# Patient Record
Sex: Female | Born: 1975
Health system: Southern US, Community
[De-identification: ages and names within clinical notes are randomized; demographics above are authoritative.]

## PROBLEM LIST (undated history)

## (undated) DIAGNOSIS — K2950 Unspecified chronic gastritis without bleeding: Secondary | ICD-10-CM

## (undated) DIAGNOSIS — K644 Residual hemorrhoidal skin tags: Secondary | ICD-10-CM

## (undated) DIAGNOSIS — F419 Anxiety disorder, unspecified: Secondary | ICD-10-CM

## (undated) DIAGNOSIS — F329 Major depressive disorder, single episode, unspecified: Secondary | ICD-10-CM

## (undated) DIAGNOSIS — F1020 Alcohol dependence, uncomplicated: Secondary | ICD-10-CM

## (undated) DIAGNOSIS — J45909 Unspecified asthma, uncomplicated: Secondary | ICD-10-CM

## (undated) DIAGNOSIS — E785 Hyperlipidemia, unspecified: Secondary | ICD-10-CM

## (undated) DIAGNOSIS — F32A Depression, unspecified: Secondary | ICD-10-CM

## (undated) DIAGNOSIS — G43909 Migraine, unspecified, not intractable, without status migrainosus: Secondary | ICD-10-CM

## (undated) DIAGNOSIS — D649 Anemia, unspecified: Secondary | ICD-10-CM

## (undated) DIAGNOSIS — K589 Irritable bowel syndrome without diarrhea: Secondary | ICD-10-CM

## (undated) DIAGNOSIS — T7840XA Allergy, unspecified, initial encounter: Secondary | ICD-10-CM

## (undated) DIAGNOSIS — K219 Gastro-esophageal reflux disease without esophagitis: Secondary | ICD-10-CM

## (undated) DIAGNOSIS — M1990 Unspecified osteoarthritis, unspecified site: Secondary | ICD-10-CM

## (undated) HISTORY — DX: Anemia, unspecified: D64.9

## (undated) HISTORY — DX: Anxiety disorder, unspecified: F41.9

## (undated) HISTORY — DX: Unspecified osteoarthritis, unspecified site: M19.90

## (undated) HISTORY — PX: OTHER SURGICAL HISTORY: SHX169

## (undated) HISTORY — DX: Allergy, unspecified, initial encounter: T78.40XA

## (undated) HISTORY — DX: Gastro-esophageal reflux disease without esophagitis: K21.9

## (undated) HISTORY — PX: KNEE SURGERY: SHX244

## (undated) HISTORY — PX: CERVICAL FUSION: SHX112

## (undated) HISTORY — DX: Major depressive disorder, single episode, unspecified: F32.9

## (undated) HISTORY — DX: Hyperlipidemia, unspecified: E78.5

## (undated) HISTORY — DX: Alcohol dependence, uncomplicated: F10.20

## (undated) HISTORY — PX: TONSILLECTOMY: SUR1361

## (undated) HISTORY — DX: Depression, unspecified: F32.A

## (undated) HISTORY — DX: Irritable bowel syndrome, unspecified: K58.9

## (undated) HISTORY — DX: Unspecified asthma, uncomplicated: J45.909

## (undated) HISTORY — PX: LAPAROSCOPIC HYSTERECTOMY: SHX1926

## (undated) HISTORY — DX: Unspecified chronic gastritis without bleeding: K29.50

## (undated) HISTORY — DX: Migraine, unspecified, not intractable, without status migrainosus: G43.909

## (undated) HISTORY — DX: Residual hemorrhoidal skin tags: K64.4

## (undated) HISTORY — PX: CHOLECYSTECTOMY: SHX55

---

## 2004-02-15 ENCOUNTER — Ambulatory Visit (HOSPITAL_COMMUNITY): Admission: RE | Admit: 2004-02-15 | Discharge: 2004-02-15 | Payer: Self-pay | Admitting: *Deleted

## 2004-02-23 ENCOUNTER — Other Ambulatory Visit: Admission: RE | Admit: 2004-02-23 | Discharge: 2004-02-23 | Payer: Self-pay | Admitting: Obstetrics and Gynecology

## 2004-02-26 ENCOUNTER — Other Ambulatory Visit: Admission: RE | Admit: 2004-02-26 | Discharge: 2004-02-26 | Payer: Self-pay | Admitting: Obstetrics and Gynecology

## 2004-05-22 ENCOUNTER — Inpatient Hospital Stay (HOSPITAL_COMMUNITY): Admission: EM | Admit: 2004-05-22 | Discharge: 2004-05-24 | Payer: Self-pay | Admitting: Emergency Medicine

## 2004-05-24 ENCOUNTER — Inpatient Hospital Stay (HOSPITAL_COMMUNITY): Admission: RE | Admit: 2004-05-24 | Discharge: 2004-05-27 | Payer: Self-pay | Admitting: Psychiatry

## 2005-06-14 ENCOUNTER — Inpatient Hospital Stay (HOSPITAL_COMMUNITY): Admission: EM | Admit: 2005-06-14 | Discharge: 2005-06-16 | Payer: Self-pay | Admitting: Emergency Medicine

## 2005-06-16 ENCOUNTER — Inpatient Hospital Stay (HOSPITAL_COMMUNITY): Admission: RE | Admit: 2005-06-16 | Discharge: 2005-06-21 | Payer: Self-pay | Admitting: Psychiatry

## 2005-06-16 ENCOUNTER — Ambulatory Visit: Payer: Self-pay | Admitting: Psychiatry

## 2010-09-29 ENCOUNTER — Emergency Department (HOSPITAL_COMMUNITY)
Admission: EM | Admit: 2010-09-29 | Discharge: 2010-09-29 | Payer: Self-pay | Source: Home / Self Care | Admitting: Emergency Medicine

## 2011-02-25 NOTE — H&P (Signed)
[name], [name]            ACCOUNT NO.:  [id number]   MEDICAL RECORD NO.:  [id number]          PATIENT TYPE:  EMS   LOCATION:  ED                           FACILITY:  [facility]   PHYSICIAN:  Hollice Espy, M.D.DATE OF BIRTH:  10-03-1976   DATE OF ADMISSION:  06/13/2005  DATE OF DISCHARGE:                                HISTORY & PHYSICAL   CONSULTANTS:  Antonietta Breach, M.D., psychiatry.   PRIMARY CARE PHYSICIAN:  None.   CHIEF COMPLAINT:  Overdose.   HISTORY OF PRESENT ILLNESS:  The patient is a 35 year old white female with  a past medical history of drug abuse, suicide attempts, and depression, who  tells me that she is unaware of the time frame etiology, but apparently she  was living in an apartment when she was kicked out.  She did not have  anywhere to go, and she took a mouthful of Risperdal.  She is not sure  exactly what time, but she feels like it was during the day.  She says the  last few days have been blurred together.  In addition, she tells me that a  few hours before that she smoked from crack cocaine.  She came into the  emergency room feeling quite agitated and jittery, and came in for further  evaluation and admission.  She says that she was, indeed, trying to kill  herself.  The patient, in the emergency room, had laboratories checked,  including a Tylenol level, which was normal, a blood sugar of 162, a  potassium level of 2.8.  Her alcohol and salicylate levels were normal.  A  Depakote level was found to be subtherapeutic, and LFT's were unremarkable.  Urine drug screen was drawn and is currently pending. The patient received  some supplemental potassium, IV fluids, and was monitored.  The ER attending  contacted poison control, and poison control recommended cardiac monitoring,  EKG, urinalysis, drug screen, and activated charcoal.  The ER attending was  made aware, but then after there were conflicting reports of the time of the  injection of  the Depakote, it was felt the ingestion was greater than 3  hours prior, and charcoal administration was not performed.  Currently, the  patient is awake and alert.  She says her jitteriness feels better.  She  denies any pain or nausea, and she is hungry.  She denies any headaches,  vision changes, dysphagia, chest pain, palpitations, shortness of breath,  wheeze, cough, abdominal pain, hematuria, dysuria, constipation, diarrhea,  focal extremity numbness, weakness, or pain.  She does confirm that she was,  indeed, trying to kill herself, and she has tried to before in the past.   PAST MEDICAL HISTORY:  1.  Multiple suicide attempts.  2.  Alcohol abuse.  3.  Depression.   MEDICATIONS:  1.  Zoloft.  2.  Risperdal.  3.  Seroquel.  4.  Depakote.  (Although she says her other medication she has not taken for several days,  and again the Depakote she took a mouthful of, which she does not know how  much.)   SOCIAL HISTORY:  She  admits to polysubstance abuse including crack cocaine.  She admits to drinking a few drinks, but nothing in the LAD day of so, and  positive for tobacco abuse.   FAMILY HISTORY:  Noncontributory.   ALLERGIES:  She has reported allergies to PENICILLIN.   PHYSICAL EXAMINATION:  VITAL SIGNS:  The patient's vitals on admission  revealed temperature of 96.8, heart rate initially 129, now down to 93,  blood pressure is 111/63, respirations 26, O2 saturation 97% on room air.  GENERAL:  The patient is alert and oriented x3 in no apparent distress.  HEENT:  Normocephalic and atraumatic.  Mucous membranes are slightly dry.  She has no carotid bruits.  HEART:  Regular rate and rhythm.  S1 and S2.  LUNGS:  Clear to auscultation bilaterally.  ABDOMEN:  Soft, nontender, nondistended.  Positive bowel sounds.  EXTREMITIES:  No clubbing, cyanosis, or edema.   LABORATORY WORK:  Tylenol level less than 10.  Sodium 139, potassium 2.8,  chloride 106, bicarbonate 22, BUN 6,  creatinine 1, glucose 162.  Urine drug  screen pending.  Alcohol level less than 5.  Salicylate level less than 4.  Depakote level less than 10, which is subtherapeutic for her.  Urinalysis  pending.  Urine pregnancy pending.  Liver function tests are negative.   ASSESSMENT AND PLAN:  1.  Drug overdose with Depakote, as well as crack cocaine abuse.  The      patient's tachycardia is likely from the crack cocaine, which is an      unknown period of ingestion.  I do not believe that this was earlier      today, and may have been approximately a day and a half ago, as the      patient's last few days, she says, are blurred together.  This is a      clear suicide attempt, or at least a cry for help, and I will admit the      patient for 24-hour observation in the step-down unit and treat      symptomatically.  According to poison control, Depakote's main toxicity      can lead to QT prolongation and respiratory depression, so we will      follow her, including cardiac enzymes, EKG, and follow up labs in the      morning.  2.  In regards to her hyperglycemia, she has no previous history of      diabetes.  Will check a hemoglobin A1C and CBGs b.i.d.  3.  In regards to her hypokalemia, she has already received supplemental      potassium, and will recheck in the morning and replace as needed.  4.  I have already put a consult in to Dr. Jeanie Sewer from psychiatry, who      will see the patient, and when she is medically stable will work on      transferring her to a mental health facility.      Hollice Espy, M.D.  Electronically Signed     SKK/MEDQ  D:  06/13/2005  T:  06/13/2005  Job:  [id number]   cc:   Antonietta Breach, M.D.

## 2011-02-25 NOTE — Discharge Summary (Signed)
[name], [name]            ACCOUNT NO.:  [id number]   MEDICAL RECORD NO.:  [id number]          PATIENT TYPE:  IPS   LOCATION:  0304                          [facility]ILITY[facility]  [facility]   PHYSICIAN:  Jeanice Lim, M.D. DATE OF BIRTH:  1976-01-09   DATE OF ADMISSION:  06/16/2005  DATE OF DISCHARGE:  06/21/2005                                 DISCHARGE SUMMARY   IDENTIFYING DATA:  This is a 35 year old single Caucasian female, single,  voluntarily admitted with a history of drug abuse prior to overdose.  Began  drinking a month ago, drinking out of control for the past two weeks, began  using cocaine after being intoxicated, became agitated, took a handful of  Risperdal, not taking medications consistently for the last two weeks.  Feels like I'm a screw up.  Shame, guilt, worthless.  Second [facility] admission.  Previous diagnosis of bipolar disorder.  Again, a  prior overdose.  History of cocaine and alcohol dependency and abuse.  History of multiple overdoses and polysubstance abuse.   MEDICATIONS:  Depakote, Zoloft and Risperdal.   ALLERGIES:  PENICILLIN and CODEINE.   PHYSICAL EXAMINATION:  Physical and neurologic exam within normal limits.   LABORATORY DATA:  Routine admission labs within normal limits.   MENTAL STATUS EXAM:  Fully alert, anxious.  Affect blunted.  Cooperative.  Speech normal.  Mood depressed and anxious.  Thought processes positive for  suicidal ideation.  No homicidal ideation.  No psychotic symptoms.  Impulse  control questionable.  Judgment and insight were fair to impaired.  Cognitively intact.   ADMISSION DIAGNOSES:  AXIS I:  Bipolar disorder, type 2.  Alcohol  dependence. Cocaine, likely dependence.  Polysubstance abuse.  Rule out  substance-induced mood disorder superimposed on bipolar disorder, type 2.  AXIS II:  Deferred.  AXIS III:  Status post Risperdal overdose and normocytic anemia.  AXIS IV:  Severe (problems with frequent  relapses and sequela of substance  abuse, also reports having some financial issues).  AXIS V:  25/65.   HOSPITAL COURSE:  The patient was admitted and ordered routine p.r.n.  medications and underwent further monitoring.  Was encouraged to participate  in individual, group and milieu therapy.  The patient was optimized on  medications and monitored for safety.  Monitored for withdrawal symptoms and  detox.  Emergency room cleared patient medically prior to transfer and  patient was stabilized.   CONDITION ON DISCHARGE:  Discharged in improved condition, reporting  motivation to be compliant with the aftercare plan.  Willing to remain  abstinent.  Had a support system.  Would be discharged in the care of the  mother and relapse prevention plan was also in place.  The patient was again  given medication education.   DISCHARGE MEDICATIONS:  1.  Seroquel 25 mg, 1/2 q.8h. p.r.n.  2.  Vistaril 50 mg q.8h. p.r.n. anxiety.  3.  Trazodone 50 mg at 8 p.m.  4.  Depakote 500 mg, 3 at 8 p.m.  5.  Risperdal 2 mg at 8 p.m.  6.  Zoloft 50 mg q.a.m.  7.  Ambien 10  mg q.h.s. p.r.n. insomnia.  8.  Topamax 25 mg q.a.m. and 50 mg at 6 p.m.   FOLLOW UP:  The patient was discharged to follow up with outpatient  medication management and therapy.   DISCHARGE DIAGNOSES:  AXIS I:  Bipolar disorder, type 2.  Alcohol  dependence. Cocaine, likely dependence.  Polysubstance abuse.  Rule out  substance-induced mood disorder superimposed on bipolar disorder, type 2.  AXIS II:  Deferred.  AXIS III:  Status post Risperdal overdose and normocytic anemia.  AXIS IV:  Severe (problems with frequent relapses and sequela of substance  abuse, also reports having some financial issues).  AXIS V:  GAF on discharge 55.      Jeanice Lim, M.D.  Electronically Signed     JEM/MEDQ  D:  08/03/2005  T:  08/04/2005  Job:  [id number]

## 2011-02-25 NOTE — Discharge Summary (Signed)
[name], [name]            ACCOUNT NO.:  [id number]   MEDICAL RECORD NO.:  [id number]          PATIENT TYPE:  IPS   LOCATION:  0304                          [facility]ACILITY:  [facility]   PHYSICIAN:  Deirdre Peer. Polite, M.D. DATE OF BIRTH:  09-23-1976   DATE OF ADMISSION:  06/16/2005  DATE OF DISCHARGE:  06/21/2005                                 DISCHARGE SUMMARY   DISCHARGE DIAGNOSES:  1.  Suicide attempt by ingestion of Risperdal.  2.  History of multiple suicide attempts.  3.  History of alcohol abuse.  4.  Depression.   DISPOSITION:  The patient will be discharged to [facility] for further treatment for suicide attempt.   CONSULTATIONS:  Dr. Jeanie Sewer.   DISCHARGE MEDICATIONS:  None.   HISTORY OF PRESENT ILLNESS:  A 35 year old female with multiple medical  problems as stated above who presented to the ED for evaluation after  suicide attempt.  Admission was deemed necessary for further evaluation and  treatment.  Please see dictated H&P for further details.   PAST MEDICAL HISTORY:  Significant for multiple suicide attempts, alcohol  abuse and depression.   ADMISSION MEDICATIONS:  1.  Zoloft.  2.  Risperdal.  3.  Seroquel.  4.  Depakote.   SOCIAL HISTORY:  Per admission H&P.   FAMILY HISTORY:  Noncontributory.   ALLERGIES:  Reported allergy to PENICILLIN.   HOSPITAL COURSE:  The patient was admitted to a telemetry bed for evaluation  and treatment of suicide attempt.  The patient had labs.  CBC essentially  within normal limits.  BMET within normal limits.  Urine drug screen  positive for benzodiazepines.  The patient was monitored in the ICU, and  provided with symptomatic care.  Psychiatric consultation was obtained.  The patient was ultimately seen by  psychiatry, and the patient volunteered to be admitted to the [facility] for further treatment of her suicidal ideation.  The patient  was discharged in stable  condition.      Deirdre Peer. Polite, M.D.  Electronically Signed     RDP/MEDQ  D:  06/29/2005  T:  06/30/2005  Job:  [id number]
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
## 2012-01-28 ENCOUNTER — Emergency Department (HOSPITAL_COMMUNITY): Payer: Commercial Indemnity

## 2012-01-28 ENCOUNTER — Encounter (HOSPITAL_COMMUNITY): Payer: Self-pay | Admitting: *Deleted

## 2012-01-28 ENCOUNTER — Emergency Department (HOSPITAL_COMMUNITY)
Admission: EM | Admit: 2012-01-28 | Discharge: 2012-01-28 | Disposition: A | Discharge status: Home / Self Care | Payer: Commercial Indemnity | Attending: Emergency Medicine | Admitting: Emergency Medicine

## 2012-01-28 DIAGNOSIS — R296 Repeated falls: Secondary | ICD-10-CM | POA: Insufficient documentation

## 2012-01-28 DIAGNOSIS — M79609 Pain in unspecified limb: Secondary | ICD-10-CM | POA: Insufficient documentation

## 2012-01-28 DIAGNOSIS — F17200 Nicotine dependence, unspecified, uncomplicated: Secondary | ICD-10-CM | POA: Insufficient documentation

## 2012-01-28 DIAGNOSIS — M25569 Pain in unspecified knee: Secondary | ICD-10-CM | POA: Insufficient documentation

## 2012-01-28 DIAGNOSIS — S93409A Sprain of unspecified ligament of unspecified ankle, initial encounter: Secondary | ICD-10-CM | POA: Insufficient documentation

## 2012-01-28 MED ORDER — KETOROLAC TROMETHAMINE 60 MG/2ML IM SOLN
60.0000 mg | Freq: Once | INTRAMUSCULAR | Status: AC
  Administered 2012-01-28: 60 mg via INTRAMUSCULAR
  Filled 2012-01-28: qty 2

## 2012-01-28 MED ORDER — HYDROMORPHONE HCL PF 2 MG/ML IJ SOLN
2.0000 mg | Freq: Once | INTRAMUSCULAR | Status: AC
  Administered 2012-01-28: 2 mg via INTRAMUSCULAR
  Filled 2012-01-28: qty 1

## 2012-01-28 MED ORDER — ONDANSETRON 4 MG PO TBDP
4.0000 mg | ORAL_TABLET | Freq: Once | ORAL | Status: AC
  Administered 2012-01-28: 4 mg via ORAL
  Filled 2012-01-28: qty 1

## 2012-01-28 MED ORDER — OXYCODONE-ACETAMINOPHEN 5-325 MG PO TABS: 1.0000 | ORAL_TABLET | Freq: Four times a day (QID) | ORAL | Status: AC | PRN

## 2012-01-28 NOTE — Progress Notes (Signed)
Orthopedic Tech Progress Note Patient Details:  [name] October 08, 1976 [id number]  Other Ortho Devices Type of Ortho Device: Crutches Ortho Device Interventions: Ordered  Type of Splint: Ankle Air Splint Location: (L) LE Splint Interventions: Application    [name] 01/28/2012, 6:32 PM

## 2012-01-28 NOTE — ED Notes (Addendum)
To ED for eval aft twisting left ankle, fell, and injured left knee PTA. Swelling noted to ankle.

## 2012-01-28 NOTE — Discharge Instructions (Signed)
Ankle Sprain An ankle sprain is an injury to the strong, fibrous tissues (ligaments) that hold the bones of your ankle joint together.  CAUSES Ankle sprain usually is caused by a fall or by twisting your ankle. People who participate in sports are more prone to these types of injuries.  SYMPTOMS  Symptoms of ankle sprain include:  Pain in your ankle. The pain may be present at rest or only when you are trying to stand or walk.   Swelling.   Bruising. Bruising may develop immediately or within 1 to 2 days after your injury.   Difficulty standing or walking.  DIAGNOSIS  Your caregiver will ask you details about your injury and perform a physical exam of your ankle to determine if you have an ankle sprain. During the physical exam, your caregiver will press and squeeze specific areas of your foot and ankle. Your caregiver will try to move your ankle in certain ways. An X-ray exam may be done to be sure a bone was not broken or a ligament did not separate from one of the bones in your ankle (avulsion).  TREATMENT  Certain types of braces can help stabilize your ankle. Your caregiver can make a recommendation for this. Your caregiver may recommend the use of medication for pain. If your sprain is severe, your caregiver may refer you to a surgeon who helps to restore function to parts of your skeletal system (orthopedist) or a physical therapist. HOME CARE INSTRUCTIONS  Apply ice to your injury for 1 to 2 days or as directed by your caregiver. Applying ice helps to reduce inflammation and pain.  Put ice in a plastic bag.   Place a towel between your skin and the bag.   Leave the ice on for 15 to 20 minutes at a time, every 2 hours while you are awake.   Take over-the-counter or prescription medicines for pain, discomfort, or fever only as directed by your caregiver.   Keep your injured leg elevated, when possible, to lessen swelling.   If your caregiver recommends crutches, use them as  instructed. Gradually, put weight on the affected ankle. Continue to use crutches or a cane until you can walk without feeling pain in your ankle.   If you have a plaster splint, wear the splint as directed by your caregiver. Do not rest it on anything harder than a pillow the first 24 hours. Do not put weight on it. Do not get it wet. You may take it off to take a shower or bath.   You may have been given an elastic bandage to wear around your ankle to provide support. If the elastic bandage is too tight (you have numbness or tingling in your foot or your foot becomes cold and blue), adjust the bandage to make it comfortable.   If you have an air splint, you may blow more air into it or let air out to make it more comfortable. You may take your splint off at night and before taking a shower or bath.   Wiggle your toes in the splint several times per day if you are able.  SEEK MEDICAL CARE IF:   You have an increase in bruising, swelling, or pain.   Your toes feel cold.   Pain relief is not achieved with medication.  SEEK IMMEDIATE MEDICAL CARE IF: Your toes are numb or blue or you have severe pain. MAKE SURE YOU:   Understand these instructions.   Will watch your condition.     Will get help right away if you are not doing well or get worse.  Document Released: 09/26/2005 Document Revised: 09/15/2011 Document Reviewed: 04/30/2008 [employer] Patient Information 2012 [employer]. 

## 2012-01-28 NOTE — ED Provider Notes (Addendum)
History     CSN: [id number]  Arrival date & time 01/28/12  1638   First MD Initiated Contact with Patient 01/28/12 1716      Chief Complaint  Patient presents with  . Leg Injury  . Ankle Injury    (Consider location/radiation/quality/duration/timing/severity/associated sxs/prior treatment) Patient is a 36 y.o. female presenting with lower extremity injury. The history is provided by the patient.  Ankle Injury This is a new (She was running to Catch a football when she jumped she landed on her foot wrong) problem. The current episode started 1 to 2 hours ago. The problem occurs constantly. The problem has not changed since onset.Associated symptoms comments: None except for swelling of the left ankle. The symptoms are aggravated by walking, standing and twisting. The symptoms are relieved by ice. She has tried a cold compress for the symptoms. The treatment provided no relief.    History reviewed. No pertinent past medical history.  Past Surgical History  Procedure Date  . Cervical fusion     History reviewed. No pertinent family history.  History  Substance Use Topics  . Smoking status: Current Some Day Smoker -- 0.5 packs/day    Types: Cigarettes  . Smokeless tobacco: Not on file  . Alcohol Use: No    OB History    Grav Para Term Preterm Abortions TAB SAB Ect Mult Living                  Review of Systems  All other systems reviewed and are negative.    Allergies  Penicillins  Home Medications   Current Outpatient Rx  Name Route Sig Dispense Refill  . ALPRAZOLAM 1 MG PO TABS Oral Take 1 mg by mouth at bedtime.    Marland Kitchen CIMZIA Alicia Subcutaneous Inject into the skin every 30 (thirty) days.    Marland Kitchen DIVALPROEX SODIUM ER 500 MG PO TB24 Oral Take 1,500 mg by mouth at bedtime.    Marland Kitchen ESTRADIOL 1 MG PO TABS Oral Take 1 mg by mouth at bedtime.    Marland Kitchen FOLIC ACID 1 MG PO TABS Oral Take 1 mg by mouth at bedtime.    Marland Kitchen LITHIUM CARBONATE 300 MG PO CAPS Oral Take 300 mg by mouth at  bedtime.    Marland Kitchen LORATADINE 10 MG PO TABS Oral Take 10 mg by mouth every morning.    Marland Kitchen OXCARBAZEPINE ER 600 MG PO TB24 Oral Take 1,800 mg by mouth at bedtime.    Marland Kitchen PHENTERMINE HCL 37.5 MG PO CAPS Oral Take 37.5 mg by mouth every morning.      BP 133/81  Pulse 91  Temp(Src) 98.6 F (37 C) (Oral)  Resp 16  SpO2 96%  Physical Exam  Nursing note and vitals reviewed. Constitutional: She is oriented to person, place, and time. She appears well-developed and well-nourished. She appears distressed.  HENT:  Head: Normocephalic and atraumatic.  Musculoskeletal: She exhibits tenderness.       Left ankle: She exhibits decreased range of motion and swelling. tenderness. Lateral malleolus and proximal fibula tenderness found. No posterior TFL and no head of 5th metatarsal tenderness found.  Neurological: She is alert and oriented to person, place, and time.  Skin: Skin is warm and dry. No rash noted. No erythema.    ED Course  Procedures (including critical care time)  Labs Reviewed - No data to display Dg Ankle Complete Left  01/28/2012  *RADIOLOGY REPORT*  Clinical Data: Left leg pain/swelling, ankle injury  LEFT ANKLE COMPLETE -  3+ VIEW  Comparison: None.  Findings: No fracture or dislocation is seen.  The ankle mortise is intact.  The base of the fifth metatarsal is unremarkable.  Small plantar calcaneal enthesopathy.  Moderate soft tissue swelling, most prominent laterally.  IMPRESSION: No fracture or dislocation is seen.  Moderate lateral soft tissue swelling.  Original Report Authenticated By: Charline Bills, M.D.   Dg Knee Complete 4 Views Left  01/28/2012  *RADIOLOGY REPORT*  Clinical Data: Trauma and pain.  Patient unable to remove clothing.  LEFT KNEE - COMPLETE 4+ VIEW  Comparison: None.  Findings: No acute fracture or dislocation.  Overlying clothing artifact. No joint effusion.  IMPRESSION: No acute osseous abnormality.  Original Report Authenticated By: Consuello Bossier, M.D.     1.  Ankle sprain       MDM   Patient with a mechanical fall and he diverted her left foot with ankle pain and swelling. Mild fibular head tenderness as well. Plain films of the ankle and knee including the proximal fibular head probably within normal limits. Severe ankle sprain. Patient placed in splint and given crutches with followup.        Gwyneth Sprout, MD 01/28/12 1816  Gwyneth Sprout, MD 01/28/12 1610

## 2012-09-08 LAB — HM PAP SMEAR: HM Pap smear: NORMAL

## 2013-02-05 ENCOUNTER — Encounter: Payer: Self-pay | Admitting: Internal Medicine

## 2013-02-22 ENCOUNTER — Encounter: Payer: Self-pay | Admitting: Internal Medicine

## 2013-02-26 ENCOUNTER — Encounter: Payer: Self-pay | Admitting: Internal Medicine

## 2013-02-26 ENCOUNTER — Other Ambulatory Visit (INDEPENDENT_AMBULATORY_CARE_PROVIDER_SITE_OTHER): Payer: 59

## 2013-02-26 ENCOUNTER — Ambulatory Visit (INDEPENDENT_AMBULATORY_CARE_PROVIDER_SITE_OTHER): Payer: 59 | Admitting: Internal Medicine

## 2013-02-26 ENCOUNTER — Other Ambulatory Visit: Payer: Self-pay | Admitting: Gastroenterology

## 2013-02-26 VITALS — BP 126/72 | HR 93 | Ht 65.0 in | Wt 242.0 lb

## 2013-02-26 DIAGNOSIS — K529 Noninfective gastroenteritis and colitis, unspecified: Secondary | ICD-10-CM

## 2013-02-26 DIAGNOSIS — K625 Hemorrhage of anus and rectum: Secondary | ICD-10-CM

## 2013-02-26 DIAGNOSIS — G43709 Chronic migraine without aura, not intractable, without status migrainosus: Secondary | ICD-10-CM | POA: Insufficient documentation

## 2013-02-26 DIAGNOSIS — K219 Gastro-esophageal reflux disease without esophagitis: Secondary | ICD-10-CM

## 2013-02-26 DIAGNOSIS — F319 Bipolar disorder, unspecified: Secondary | ICD-10-CM | POA: Insufficient documentation

## 2013-02-26 DIAGNOSIS — R197 Diarrhea, unspecified: Secondary | ICD-10-CM

## 2013-02-26 DIAGNOSIS — IMO0002 Reserved for concepts with insufficient information to code with codable children: Secondary | ICD-10-CM | POA: Insufficient documentation

## 2013-02-26 DIAGNOSIS — M069 Rheumatoid arthritis, unspecified: Secondary | ICD-10-CM | POA: Insufficient documentation

## 2013-02-26 LAB — COMPREHENSIVE METABOLIC PANEL
Albumin: 4 g/dL (ref 3.5–5.2)
Alkaline Phosphatase: 73 U/L (ref 39–117)
BUN: 10 mg/dL (ref 6–23)
CO2: 29 mEq/L (ref 19–32)
Calcium: 9.5 mg/dL (ref 8.4–10.5)
Chloride: 103 mEq/L (ref 96–112)
GFR: 97.92 mL/min (ref >60.00)
Glucose, Bld: 86 mg/dL (ref 70–99)
Potassium: 4.4 mEq/L (ref 3.5–5.1)
Total Protein: 7.5 g/dL (ref 6.0–8.3)

## 2013-02-26 LAB — CBC
MCV: 90 fl (ref 78.0–100.0)
Platelets: 290 10*3/uL (ref 150.0–400.0)
RBC: 4.49 Mil/uL (ref 3.87–5.11)
WBC: 8.5 10*3/uL (ref 4.5–10.5)

## 2013-02-26 MED ORDER — OMEPRAZOLE 40 MG PO CPDR: 40.0000 mg | DELAYED_RELEASE_CAPSULE | Freq: Every day | ORAL | Status: DC

## 2013-02-26 MED ORDER — PEG-KCL-NACL-NASULF-NA ASC-C 100 G PO SOLR: 1.0000 | Freq: Once | ORAL | Status: DC

## 2013-02-26 MED ORDER — DIPHENOXYLATE-ATROPINE 2.5-0.025 MG PO TABS: 1.0000 | ORAL_TABLET | Freq: Four times a day (QID) | ORAL | Status: DC | PRN

## 2013-02-26 NOTE — Patient Instructions (Addendum)
You have been scheduled for a colonoscopy with propofol. Please follow written instructions given to you at your visit today.  Please pick up your prep kit at the pharmacy within the next 1-3 days. If you use inhalers (even only as needed), please bring them with you on the day of your procedure. Your physician has requested that you go to www.startemmi.com and enter the access code given to you at your visit today. This web site gives a general overview about your procedure. However, you should still follow specific instructions given to you by our office regarding your preparation for the procedure.  Your physician has requested that you go to the basement for the following lab work before leaving today: Celiac panel, TSH, CBC, CMP, Fecal elastace, GI Pathogen panel  We have sent the following medications to your pharmacy for you to pick up at your convenience: moviprep, Lomotil  You have been scheduled to see   Dr. Beverely Low  On July 04/11/2013 @ 3:00pm please arrive at [facility] is located: [address] [place], [place] [phone]                                               We are excited to introduce MyChart, a new best-in-class service that provides you online access to important information in your electronic medical record. We want to make it easier for you to view your health information - all in one secure location - when and where you need it. We expect MyChart will enhance the quality of care and service we provide.  When you register for MyChart, you can:    View your test results.    Request appointments and receive appointment reminders via email.    Request medication renewals.    View your medical history, allergies, medications and immunizations.    Communicate with your physician's office through a password-protected site.    Conveniently print information such as your medication lists.  To find out if MyChart is right for you, please talk to a  member of our clinical staff today. We will gladly answer your questions about this free health and wellness tool.  If you are age 38 or older and want a member of your family to have access to your record, you must provide written consent by completing a proxy form available at our office. Please speak to our clinical staff about guidelines regarding accounts for patients younger than age 76.  As you activate your MyChart account and need any technical assistance, please call the MyChart technical support line at [phone]) or email your question to [email or web address]. If you email your question(s), please include your name, a return phone number and the best time to reach you.  If you have non-urgent health-related questions, you can send a message to our office through MyChart at [email or web address]. If you have a medical emergency, call 911.  Thank you for using MyChart as your new health and wellness resource!   MyChart licensed from [employer],  [id number]. Patents Pending.

## 2013-02-26 NOTE — Progress Notes (Signed)
Patient ID: [name], female   DOB: 09-25-1976, 37 y.o.   MRN: [id number] HPI: [name] is a 37 year old female with a past medical history significant for bipolar depression with rapid cycling, migraine, rheumatoid arthritis on methotrexate and intermittently adalimumab, and irritable bowel syndrome who is seen to evaluate worsening chronic diarrhea with rectal bleeding. She is alone today. She reports she is "almost always had trouble with her stomach" and has had issues with diarrhea and loose stools dating back over 10 years. She notes however, over the last 6 months she's had significant worsening of her diarrhea. This is occurring on a daily basis and can happen 5-15 times a day. She reports it does wake her from sleep every now and then. She reports she gets an immediate lower abdominal cramping associated with bowel movement along with an urgency which is worse with eating. Occasionally she does see red blood mixed in with her diarrhea. She denies fevers or chills. She reports often feeling hot but denies flushing. No rashes. She's had some nausea and vomiting, and notes that this is gone on for years and years. Her vomiting occasionally history of regurgitation or reflux. She is currently taking omeprazole 40 mg twice daily for heartburn with good symptom control currently. She started Lamictal approximately a month ago for her bipolar disorder. She's been on carbamazepine for about a year for this as well.  She does note some abdominal bloating and increase in lower GI gas. She had her gallbladder out in 2000 and felt like this worsened her diarrhea for some time.  She reports she is taking methotrexate 12.5 mg per week for her rheumatoid arthritis and has been prescribed adalimumab. Due to insurance and financial reasons she estimates she's taken this approximately 3 times in the last 6 months. She denies significant weight loss. Appetite fluctuates.  Past Medical History  Diagnosis Date   . Migraine   . Alcoholism   . Arthritis   . Asthma   . Depression   . GERD (gastroesophageal reflux disease)   . IBS (irritable bowel syndrome)      Past Surgical History  Procedure Laterality Date  . Cervical fusion    . Cholecystectomy    . Laparoscopic hysterectomy      Current Outpatient Prescriptions  Medication Sig Dispense Refill  . adalimumab (HUMIRA) 40 MG/0.8ML injection Inject 40 mg into the skin once.      . ALPRAZolam (XANAX) 1 MG tablet Take 1 mg by mouth as needed.       . carbamazepine (EQUETRO) 200 MG CP12 Take 400 mg by mouth daily.      . Cyanocobalamin (VITAMIN B-12) 1000 MCG/15ML LIQD Take 15 mLs by mouth every 30 (thirty) days.      . Diclofenac Potassium (ZIPSOR) 25 MG CAPS Take 1 capsule by mouth as needed.      Marland Kitchen estradiol (ESTRACE) 1 MG tablet Take 1 mg by mouth at bedtime.      . fexofenadine (ALLEGRA) 180 MG tablet Take 180 mg by mouth daily.      . folic acid (FOLVITE) 1 MG tablet Take 1 mg by mouth at bedtime.      . methotrexate (RHEUMATREX) 2.5 MG tablet Take 5 mg by mouth once a week. Caution:Chemotherapy. Protect from light.      . phentermine 37.5 MG capsule Take 37.5 mg by mouth every morning.      . SUMAtriptan (IMITREX) 50 MG tablet Take 50 mg by mouth every 2 (two)  hours as needed for migraine.      . diphenoxylate-atropine (LOMOTIL) 2.5-0.025 MG per tablet Take 1 tablet by mouth 4 (four) times daily as needed for diarrhea or loose stools.  30 tablet  0  . omeprazole (PRILOSEC) 40 MG capsule Take 1 capsule (40 mg total) by mouth daily.  30 capsule  0  . peg 3350 powder (MOVIPREP) 100 G SOLR Take 1 kit (100 g total) by mouth once.  1 kit  0   No current facility-administered medications for this visit.    Allergies  Allergen Reactions  . Penicillins Other (See Comments)    "skin turns black"    Family History  Problem Relation Age of Onset  . Breast cancer Paternal Grandmother   . Heart disease Maternal Grandfather     History   Substance Use Topics  . Smoking status: Current Every Day Smoker -- 0.50 packs/day    Types: Cigarettes  . Smokeless tobacco: Never Used  . Alcohol Use: No    ROS: As per history of present illness, otherwise negative  BP 126/72  Pulse 93  Ht 5\' 5"  (1.651 m)  Wt 242 lb (109.77 kg)  BMI 40.27 kg/m2 Constitutional: Well-developed and well-nourished. No distress. HEENT: Normocephalic and atraumatic. Oropharynx is clear and moist. No oropharyngeal exudate. Conjunctivae are normal.  No scleral icterus. Neck: Neck supple. Trachea midline. Cardiovascular: Normal rate, regular rhythm and intact distal pulses. No M/R/G Pulmonary/chest: Effort normal and breath sounds normal. No wheezing, rales or rhonchi. Abdominal: Soft, mild diffuse tenderness without rebound or guarding, nondistended. Bowel sounds active throughout.  Extremities: no clubbing, cyanosis, or edema Lymphadenopathy: No cervical adenopathy noted. Neurological: Alert and oriented to person place and time. Skin: Skin is warm and dry. No rashes noted. Psychiatric: Normal mood and affect. Behavior is normal.  ASSESSMENT/PLAN:  37 year old female with a past medical history significant for bipolar depression with rapid cycling, migraine, rheumatoid arthritis on methotrexate and intermittently adalimumab, and irritable bowel syndrome who is seen to evaluate worsening chronic diarrhea with rectal bleeding.   1.  Chronic diarrhea, intermittent rectal bleeding -- it sounds like certainly some of her GI symptoms are irritable, but the bleeding with her diarrhea is not consistent with irritable bowel syndrome. I recommended labs today to include celiac panel, TSH, CBC, CMP. I will also check a GI pathogen panel to ensure there is no ongoing infectious process. We will collect a fecal elastace. I recommended colonoscopy, and after discussion of the risks and benefits she is agreeable to proceed. This will be performed assuming her stool  studies are negative. I've given her a prescription for Lomotil to be used 4 times a day as needed for diarrhea while we continue to investigate the etiology.  2.  GERD -- she will continue on omeprazole 40 mg twice daily as written by her primary provider.  3.  PCP -- she requests a new primary care in our system, and she will be referred to the [facility] location at her request.

## 2013-02-27 LAB — GASTROINTESTINAL PATHOGEN PANEL PCR
Campylobacter, PCR: NEGATIVE
Cryptosporidium, PCR: NEGATIVE
Giardia lamblia, PCR: NEGATIVE
Norovirus, PCR: POSITIVE — CR
Rotavirus A, PCR: NEGATIVE
Shigella, PCR: NEGATIVE

## 2013-02-27 LAB — TISSUE TRANSGLUTAMINASE, IGA: Transglutaminase IgA: <2 U/mL (ref 0–3)

## 2013-03-05 ENCOUNTER — Encounter: Payer: Self-pay | Admitting: Family Medicine

## 2013-03-05 NOTE — Telephone Encounter (Signed)
Please advise.//AB/CMA 

## 2013-03-14 ENCOUNTER — Encounter: Payer: Self-pay | Admitting: Internal Medicine

## 2013-03-14 ENCOUNTER — Ambulatory Visit (AMBULATORY_SURGERY_CENTER): Payer: 59 | Admitting: Internal Medicine

## 2013-03-14 VITALS — BP 124/71 | HR 89 | Temp 97.9°F | Resp 19 | Ht 65.0 in | Wt 242.0 lb

## 2013-03-14 DIAGNOSIS — K529 Noninfective gastroenteritis and colitis, unspecified: Secondary | ICD-10-CM

## 2013-03-14 DIAGNOSIS — D126 Benign neoplasm of colon, unspecified: Secondary | ICD-10-CM

## 2013-03-14 DIAGNOSIS — K625 Hemorrhage of anus and rectum: Secondary | ICD-10-CM

## 2013-03-14 DIAGNOSIS — R197 Diarrhea, unspecified: Secondary | ICD-10-CM

## 2013-03-14 MED ORDER — SODIUM CHLORIDE 0.9 % IV SOLN: 500.0000 mL | INTRAVENOUS | Status: DC

## 2013-03-14 NOTE — Progress Notes (Signed)
Called to room to assist during endoscopic procedure.  Patient ID and intended procedure confirmed with present staff. Received instructions for my participation in the procedure from the performing physician.  

## 2013-03-14 NOTE — Progress Notes (Signed)
Patient did not experience any of the following events: a burn prior to discharge; a fall within the facility; wrong site/side/patient/procedure/implant event; or a hospital transfer or hospital admission upon discharge from the facility. (G8907) Patient did not have preoperative order for IV antibiotic SSI prophylaxis. (G8918)  

## 2013-03-14 NOTE — Patient Instructions (Addendum)

## 2013-03-14 NOTE — Op Note (Signed)
[facility] [address] [place], [address]   COLONOSCOPY PROCEDURE REPORT  PATIENT: Emory, Leaver  MR#: [id number] BIRTHDATE: 08-18-1976 , 37  yrs. old GENDER: Female ENDOSCOPIST: Beverley Fiedler, MD PROCEDURE DATE:  03/14/2013 PROCEDURE:   Colonoscopy with biopsy ASA CLASS:   Class II INDICATIONS:chronic diarrhea and Rectal Bleeding. MEDICATIONS: MAC sedation, administered by CRNA and propofol (Diprivan) 450mg  IV  DESCRIPTION OF PROCEDURE:   After the risks benefits and alternatives of the procedure were thoroughly explained, informed consent was obtained.  A digital rectal exam revealed no rectal mass.   The LB [id number]  endoscope was introduced through the anus and advanced to the terminal ileum which was intubated for a short distance. No adverse events experienced.   The quality of the prep was good, using MoviPrep  The instrument was then slowly withdrawn as the colon was fully examined.     COLON FINDINGS: The mucosa appeared normal in the terminal ileum. A normal appearing cecum, ileocecal valve, and appendiceal orifice were identified.  The ascending, hepatic flexure, transverse, splenic flexure, descending, sigmoid colon and rectum appeared unremarkable.  No polyps or cancers were seen.  Multiple random biopsies of the area were performed.  Retroflexed views revealed external hemorrhoids. The time to cecum=2 minutes 54 seconds. Withdrawal time=9 minutes 00 seconds.  The scope was withdrawn and the procedure completed.  COMPLICATIONS: There were no complications.  ENDOSCOPIC IMPRESSION: 1.   Normal mucosa in the terminal ileum 2.   Normal colon; multiple random biopsies of the area were performed 3.   Small external hemorrhoids  RECOMMENDATIONS: 1.  Await pathology results 2.  Continue Lomotil as directed for 3.  Office follow-up in about 3-4 weeks to discuss other therapy for chronic diarrhea   eSigned:  Beverley Fiedler, MD  03/14/2013 2:33 PM   cc: The Patient

## 2013-03-15 ENCOUNTER — Telehealth: Payer: Self-pay | Admitting: *Deleted

## 2013-03-15 NOTE — Telephone Encounter (Signed)
Left message that we called for f/u 

## 2013-03-18 ENCOUNTER — Telehealth: Payer: Self-pay | Admitting: Gastroenterology

## 2013-03-18 NOTE — Telephone Encounter (Signed)
Called [facility] lab to find out why we have not received a result on the fecal elastase lab that shows collected, and all other labs have come back. Spoke to [employer], she said they do not have any more sample left and cannot do the lab. Per Dr. Rhea Belton when pt comes in for follow up we will have to give another sample.

## 2013-03-19 ENCOUNTER — Encounter: Payer: Self-pay | Admitting: Internal Medicine

## 2013-03-26 ENCOUNTER — Encounter: Payer: Self-pay | Admitting: Internal Medicine

## 2013-03-29 ENCOUNTER — Ambulatory Visit (INDEPENDENT_AMBULATORY_CARE_PROVIDER_SITE_OTHER): Payer: 59 | Admitting: Internal Medicine

## 2013-03-29 ENCOUNTER — Encounter: Payer: Self-pay | Admitting: Internal Medicine

## 2013-03-29 ENCOUNTER — Telehealth: Payer: Self-pay | Admitting: Gastroenterology

## 2013-03-29 VITALS — BP 118/80 | HR 88 | Ht 65.5 in | Wt 251.5 lb

## 2013-03-29 DIAGNOSIS — K589 Irritable bowel syndrome without diarrhea: Secondary | ICD-10-CM

## 2013-03-29 DIAGNOSIS — R197 Diarrhea, unspecified: Secondary | ICD-10-CM

## 2013-03-29 DIAGNOSIS — K529 Noninfective gastroenteritis and colitis, unspecified: Secondary | ICD-10-CM

## 2013-03-29 MED ORDER — ALOSETRON HCL 0.5 MG PO TABS: 0.5000 mg | ORAL_TABLET | Freq: Two times a day (BID) | ORAL | Status: DC

## 2013-03-29 NOTE — Patient Instructions (Addendum)
We have sent the following medications to your pharmacy for you to pick up at your convenience: Lotronex  Please call us with any constipation or abdominal pain  Follow up in 6-8 weeks                                               We are excited to introduce MyChart, a new best-in-class service that provides you online access to important information in your electronic medical record. We want to make it easier for you to view your health information - all in one secure location - when and where you need it. We expect MyChart will enhance the quality of care and service we provide.  When you register for MyChart, you can:    View your test results.    Request appointments and receive appointment reminders via email.    Request medication renewals.    View your medical history, allergies, medications and immunizations.    Communicate with your physician's office through a password-protected site.    Conveniently print information such as your medication lists.  To find out if MyChart is right for you, please talk to a member of our clinical staff today. We will gladly answer your questions about this free health and wellness tool.  If you are age 38 or older and want a member of your family to have access to your record, you must provide written consent by completing a proxy form available at our office. Please speak to our clinical staff about guidelines regarding accounts for patients younger than age 103.  As you activate your MyChart account and need any technical assistance, please call the MyChart technical support line at ([phone]-CHART [phone]) or email your question to [email or web address]. If you email your question(s), please include your name, a return phone number and the best time to reach you.  If you have non-urgent health-related questions, you can send a message to our office through MyChart at [email or web address]. If you have a medical emergency, call  911.  Thank you for using MyChart as your new health and wellness resource!   MyChart licensed from [employer],  [id number]. Patents Pending.

## 2013-03-29 NOTE — Progress Notes (Signed)
Subjective:    Patient ID: [name], female    DOB: 07-27-1976, 37 y.o.   MRN: [id number]  HPI Ms. Dykstra is a 37 year old female with a past medical history significant for bipolar depression with rapid cycling, migraine, rheumatoid arthritis on methotrexate and intermittently adalimumab, and irritable bowel syndrome who is seen in followup. She came for colonoscopy to evaluate chronic diarrhea on 03/14/2013. This revealed normal glucose and the terminal ileum, normal colon, and external hemorrhoids. Random biopsies performed throughout the colon were unremarkable. This showed benign colonic mucosa with no significant inflammation identified. She continues to have chronic daily diarrhea with bowel movements occurring 5-15 times per day. This is often associated with fecal urgency. It has significantly limited her life and she reports a slightly fatigued. At times she cannot do her to where she wants to because of the diarrhea. She has been using Lomotil with some success but continues to have diarrhea. No recent rectal bleeding or melena. Appetite has been fairly good. No nausea or vomiting.  Review of Systems As per history of present illness, otherwise negative  Current Medications, Allergies, Past Medical History, Past Surgical History, Family History and Social History were reviewed in [employer] record.     Objective:   Physical Exam BP 118/80  Pulse 88  Ht 5' 5.5" (1.664 m)  Wt 251 lb 8 oz (114.08 kg)  BMI 41.2 kg/m2 Constitutional: Well-developed and well-nourished. No distress. Lymphadenopathy: No cervical adenopathy noted. Neurological: Alert and oriented to person place and time. Psychiatric: Normal mood and affect. Behavior is normal.  CBC    Component Value Date/Time   WBC 8.5 02/26/2013 0935   RBC 4.49 02/26/2013 0935   HGB 14.4 02/26/2013 0935   HCT 40.5 02/26/2013 0935   PLT 290.0 02/26/2013 0935   MCV 90.0 02/26/2013 0935   MCHC 35.6  02/26/2013 0935   RDW 12.7 02/26/2013 0935   CMP     Component Value Date/Time   NA 138 02/26/2013 0935   K 4.4 02/26/2013 0935   CL 103 02/26/2013 0935   CO2 29 02/26/2013 0935   GLUCOSE 86 02/26/2013 0935   BUN 10 02/26/2013 0935   CREATININE 0.7 02/26/2013 0935   CALCIUM 9.5 02/26/2013 0935   PROT 7.5 02/26/2013 0935   ALBUMIN 4.0 02/26/2013 0935   AST 21 02/26/2013 0935   ALT 24 02/26/2013 0935   ALKPHOS 73 02/26/2013 0935   BILITOT 0.5 02/26/2013 0935    TSH - normal  Celiac panel -- negative    Assessment & Plan:  37 year old female with a past medical history significant for bipolar depression with rapid cycling, migraine, rheumatoid arthritis on methotrexate and intermittently adalimumab, and irritable bowel syndrome who is seen in followup  1.  IBS-D -- after workup including colonoscopy with random biopsies her chronic diarrhea is felt to be secondary to irritable bowel syndrome with diarrhea predominance.  We have discussed this at length today and I have recommended Lotronex.  We have discussed this medication at length including the risks which include constipation and rarely ischemic colitis. She is aware that if ischemic colitis occurs it could require hospitalization and even be life-threatening. She watched a Lotronex video describing the medication and risks today in clinic. She also read through and signed the Lotronex patient acknowledgment form. I will start her on 0.5 mg twice daily. I would like to see her back in 4-6 weeks to assess her response. She will call me immediately if she has  any problems with this medication. She will stop Lomotil when using this new medication.

## 2013-04-10 ENCOUNTER — Ambulatory Visit: Payer: 59 | Admitting: Family Medicine

## 2013-04-10 ENCOUNTER — Ambulatory Visit (INDEPENDENT_AMBULATORY_CARE_PROVIDER_SITE_OTHER): Payer: BC Managed Care – PPO | Admitting: Family

## 2013-04-10 ENCOUNTER — Encounter: Payer: Self-pay | Admitting: Family

## 2013-04-10 VITALS — BP 132/88 | HR 92 | Temp 98.1°F | Wt 248.0 lb

## 2013-04-10 DIAGNOSIS — F319 Bipolar disorder, unspecified: Secondary | ICD-10-CM

## 2013-04-10 DIAGNOSIS — M79609 Pain in unspecified limb: Secondary | ICD-10-CM

## 2013-04-10 DIAGNOSIS — M79601 Pain in right arm: Secondary | ICD-10-CM

## 2013-04-10 MED ORDER — CYCLOBENZAPRINE HCL 5 MG PO TABS: 5.0000 mg | ORAL_TABLET | Freq: Three times a day (TID) | ORAL | Status: DC | PRN

## 2013-04-10 MED ORDER — IBUPROFEN 600 MG PO TABS: 600.0000 mg | ORAL_TABLET | Freq: Three times a day (TID) | ORAL | Status: DC | PRN

## 2013-04-10 NOTE — Progress Notes (Signed)
  Subjective:    Patient ID: [name], female    DOB: 1976/06/23, 37 y.o.   MRN: [id number]  HPI  37 year old WF, smoker, patient of Dr. Beverely Low is in today with a multitude of complaints to include right arm pain, itching to her scalp and arms, nausea, body twitch, and eye twitch x 1.5 week. Right arm pain worsening over the last 2 days. She denies any injury. She feels it may be a side effect to medications. She has been on the newest medications Lamictal, Equetro, and Topamax x 1 year but only consistently x 1 month. Saw psych on Monday and had labs drawn and is awaiting results. She has made the PA aware of her symptoms but she does not feel confident in his skills because he is not a MD. Denies any changes in detergents, soaps, lotions. No environmental changes.   Review of Systems  Constitutional: Negative.   Eyes: Negative for itching and visual disturbance.       Both eyes switching  Respiratory: Negative.   Cardiovascular: Negative.   Gastrointestinal: Negative.   Genitourinary: Negative.   Musculoskeletal: Negative for back pain, joint swelling and gait problem.       Right arm pain and stiffness.   Skin:       Itching to the arms and scalp  Neurological: Negative.   Hematological: Negative.   Psychiatric/Behavioral: Positive for sleep disturbance, decreased concentration and agitation. The patient is nervous/anxious.        Objective:   Physical Exam  Constitutional: She is oriented to person, place, and time. She appears well-developed and well-nourished.  HENT:  Right Ear: External ear normal.  Left Ear: External ear normal.  Nose: Nose normal.  Mouth/Throat: Oropharynx is clear and moist.  Eyes: Pupils are equal, round, and reactive to light.  No twitching  Neck: Normal range of motion. Neck supple.  Cardiovascular: Normal rate, regular rhythm and normal heart sounds.   Pulmonary/Chest: Effort normal and breath sounds normal.  Musculoskeletal:  Right arm  tenderness to light palpation of the forearm and upper arm. No swelling, no redness, no rash. Decreased ROM with a 30 degree abduction of the RUE. Pulses 2/2. No joint tenderness.  No muscle twitching.  Neurological: She is alert and oriented to person, place, and time. She has normal reflexes. She displays normal reflexes. No cranial nerve deficit. She exhibits normal muscle tone.  Skin: Skin is warm and dry. No rash noted.  Psychiatric: She has a normal mood and affect.          Assessment & Plan:  Assessment:  1. Right Arm Pain 2. Bipolar Disorder-uncontrolled 3. Pruritus    Plan: Flexeril three times a day as needed. Ibuprofen three times a day. Patient advised to contact provider prescribing psych meds and consider a drug holiday between Topamax, Equetro, and Lamictal. See Dr. Beverely Low asap to establish. Overall, I believe her symptoms are related to uncontrolled bipolar disorder. During the office visit patient mood went from being tearful and crying to happy and laughing.

## 2013-04-10 NOTE — Patient Instructions (Addendum)
1. Consider a drug holiday between Topamax, Lamictal, and Equetro. However, consult psych (the provider prescribing), before proceeding.  2. See Dr. Beverely Low ASAP to establish 3. Flexeril and OTC Ibuprofen as needed for pain and stiffness.

## 2013-04-11 ENCOUNTER — Ambulatory Visit: Payer: 59 | Admitting: Family Medicine

## 2013-04-17 ENCOUNTER — Ambulatory Visit (INDEPENDENT_AMBULATORY_CARE_PROVIDER_SITE_OTHER): Payer: BC Managed Care – PPO | Admitting: Family Medicine

## 2013-04-17 ENCOUNTER — Encounter: Payer: Self-pay | Admitting: Family Medicine

## 2013-04-17 VITALS — BP 126/82 | HR 94 | Temp 98.3°F | Ht 66.0 in | Wt 248.0 lb

## 2013-04-17 DIAGNOSIS — M25519 Pain in unspecified shoulder: Secondary | ICD-10-CM

## 2013-04-17 DIAGNOSIS — M069 Rheumatoid arthritis, unspecified: Secondary | ICD-10-CM

## 2013-04-17 DIAGNOSIS — F319 Bipolar disorder, unspecified: Secondary | ICD-10-CM

## 2013-04-17 DIAGNOSIS — M25511 Pain in right shoulder: Secondary | ICD-10-CM | POA: Insufficient documentation

## 2013-04-17 MED ORDER — PREDNISONE 10 MG PO TABS: ORAL_TABLET | ORAL | Status: DC

## 2013-04-17 NOTE — Progress Notes (Signed)
  Subjective:    Patient ID: [name], female    DOB: [birth year], 37 y.o.   MRN: [id number]  HPI New to establish.  Previous MD- [facility])  Also seeing Rheum, Psych, GYN ([place])  RA- previously seeing Dr Ancil Linsey for this, attempting to get local rheum.  On Methotrexate and Humira (intermittently).  Reports she's currently having pain and swelling in hands, 'what I know are RA sxs'.    Bipolar disorder- currently going to the [facility] in [place].  Seeing PA Melida Quitter.  Lamictal, Carbamazepine, Alprazolam.  R shoulder pain- noted stiffness x3-4 months which worsened x2 weeks and last week 'could hardly move my arm'.  Saw [facility]onda last week and was given Flexeril and Diclofenac.  Pain has significantly improved since last week.  Notes some 'light pain'.  Hx of RA.  Improved w/ massage.  No limitation in ROM but due to pain was fearful to move.  Pt reports she has played tennis 4-5x in 6 months.  R hand dominant.  Review of Systems For ROS see HPI     Objective:   Physical Exam  Vitals reviewed. Constitutional: She appears well-developed and well-nourished. No distress.  Cardiovascular: Intact distal pulses.   Musculoskeletal: She exhibits no edema.       Right shoulder: She exhibits tenderness (w/ internal rotation) and pain (w/ impingement maneuvers). She exhibits no bony tenderness, no swelling, no effusion, no crepitus, no deformity and normal pulse.  Neurological: She is alert.  Skin: Skin is warm and dry.  Psychiatric:  Flat affect          Assessment & Plan:

## 2013-04-17 NOTE — Patient Instructions (Addendum)
We'll call you with your Rheumatology appt STOP the diclofenac and the ibuprofen while you START the prednisone (take w/ food) We'll call you with your physical therapy appt Continue the flexeril as needed Alternate heat and ice Call with any questions or concerns Hang in there!

## 2013-04-18 NOTE — Assessment & Plan Note (Signed)
New to provider.  Suspect pt w/ inflammation of rotator cuff vs bursitis.  sxs have already dramatically improved since last week.  Continue muscle relaxers, anti-inflammatories.  Refer for PT.  Pt expressed understanding and is in agreement w/ plan.

## 2013-04-18 NOTE — Assessment & Plan Note (Signed)
New to provider, chronic for pt.  Following w/ psych.  sxs better since stopping Topamax.  Will follow.

## 2013-04-18 NOTE — Assessment & Plan Note (Signed)
New to provider, chronic for pt.  In process of moving and in need of new rheumatologist.  Referral made.  Due to pain, will start short term prednisone.  Will follow.

## 2013-04-23 ENCOUNTER — Ambulatory Visit: Payer: BC Managed Care – PPO | Admitting: Rehabilitation

## 2013-05-13 ENCOUNTER — Telehealth: Payer: Self-pay | Admitting: Gastroenterology

## 2013-05-13 NOTE — Telephone Encounter (Signed)
lvm for pt to call me back to schedule a follow up appointment with Dr. Rhea Belton

## 2013-05-13 NOTE — Telephone Encounter (Signed)
Message copied by Richardo Hanks on Mon May 13, 2013  9:29 AM ------      Message from: Beverley Fiedler      Created: Mon May 06, 2013  9:26 AM       She needs follow-up with me sometime in Aug after starting Lotronex.      Thanks      JMP ------

## 2013-05-29 ENCOUNTER — Telehealth: Payer: Self-pay | Admitting: Gastroenterology

## 2013-05-29 ENCOUNTER — Ambulatory Visit (INDEPENDENT_AMBULATORY_CARE_PROVIDER_SITE_OTHER): Payer: BC Managed Care – PPO | Admitting: Internal Medicine

## 2013-05-29 ENCOUNTER — Encounter: Payer: Self-pay | Admitting: Internal Medicine

## 2013-05-29 ENCOUNTER — Other Ambulatory Visit: Payer: Self-pay | Admitting: Gastroenterology

## 2013-05-29 VITALS — BP 120/70 | HR 103 | Temp 97.7°F | Wt 245.8 lb

## 2013-05-29 DIAGNOSIS — R21 Rash and other nonspecific skin eruption: Secondary | ICD-10-CM

## 2013-05-29 MED ORDER — TRIAMCINOLONE ACETONIDE 0.1 % EX LOTN: TOPICAL_LOTION | Freq: Three times a day (TID) | CUTANEOUS | Status: DC

## 2013-05-29 NOTE — Progress Notes (Signed)
  Subjective:    Patient ID: [name], female    DOB: 08/21/1976, 37 y.o.   MRN: [id number]  HPI Acute visit 2 months history of itchy spots in the scalp, worse in the last 3 or 4 weeks, mostly on the sides. She takes multiple medications. Started tafacitinib 2 weeks ago Stopped methotrexate 2 months ago. Stop Humira 2 months ago Did start lotrenex  about 2 months ago  Past Medical History  Diagnosis Date  . Migraine   . Alcoholism   . Arthritis   . Asthma   . Depression   . GERD (gastroesophageal reflux disease)   . IBS (irritable bowel syndrome)   . Allergy   . Anemia   . Anxiety    Past Surgical History  Procedure Laterality Date  . Cervical fusion    . Cholecystectomy    . Laparoscopic hysterectomy    . Tonsillectomy    . Inguinal hernai repair        Review of Systems No fever or chills. Mild itchiness in the scalp. No previous rash like this before. No other areas involved.    Objective:   Physical Exam  General -- alert, well-developed, NAD. Skin-- Healthy scalp except for a few skin lesions on the sides. They look like a very superficial ulcers ~ 1 mm in size with mild erythema around. No pustules or blisters. Scalp is somehow bumpy but no discrete masses. Psych-- Cognition and judgment appear intact. Alert and cooperative with normal attention span and concentration. not anxious appearing and not depressed appearing.      Assessment & Plan:  Rash, Unclear etiology, no pustules or blisters. The only new med  she started ~ 2 months ago is Lotronex and rash is not one of the side effects. Could be related to rheumatoid arthritis? Plan: Trial with Kenalog lotion, if not better will need further eval. See instructions.

## 2013-05-29 NOTE — Patient Instructions (Addendum)
Apply the lotion 2 or 3 times a day x 10 days If areas get worse or not better in 10 days please call

## 2013-05-29 NOTE — Telephone Encounter (Signed)
Sent office visit letter to pt at address we have on file. Dr. Rhea Belton would like to see pt once she has started Lotronex

## 2013-05-30 ENCOUNTER — Encounter: Payer: Self-pay | Admitting: Internal Medicine

## 2013-06-06 ENCOUNTER — Encounter: Payer: Self-pay | Admitting: Internal Medicine

## 2013-06-11 ENCOUNTER — Ambulatory Visit (INDEPENDENT_AMBULATORY_CARE_PROVIDER_SITE_OTHER): Payer: BC Managed Care – PPO | Admitting: Internal Medicine

## 2013-06-11 ENCOUNTER — Encounter: Payer: Self-pay | Admitting: Internal Medicine

## 2013-06-11 VITALS — BP 122/82 | HR 85 | Ht 66.0 in | Wt 246.0 lb

## 2013-06-11 DIAGNOSIS — R152 Fecal urgency: Secondary | ICD-10-CM

## 2013-06-11 DIAGNOSIS — R197 Diarrhea, unspecified: Secondary | ICD-10-CM

## 2013-06-11 DIAGNOSIS — K589 Irritable bowel syndrome without diarrhea: Secondary | ICD-10-CM

## 2013-06-11 MED ORDER — ALOSETRON HCL 0.5 MG PO TABS: 0.5000 mg | ORAL_TABLET | Freq: Two times a day (BID) | ORAL | Status: DC

## 2013-06-11 NOTE — Progress Notes (Signed)
  Subjective:    Patient ID: [name], female    DOB: 09-17-1976, 37 y.o.   MRN: [id number]  HPI Ms. Magda, Georgiann Hahn,  is a 37 year old female with a past medical history significant for bipolar depression with rapid cycling, migraine, rheumatoid arthritis on methotrexate and intermittently adalimumab, and irritable bowel syndrome-D who is seen in followup. She was last seen about 2 months ago and was started on Lotronex for IBS-D.  She had a colonoscopy to evaluate chronic diarrhea on 03/14/2013. This revealed normal glucose and the terminal ileum, normal colon, and external hemorrhoids. Random biopsies performed throughout the colon were unremarkable. She reports a dramatic improvement in her diarrhea, fecal urgency, and abdominal cramping after starting Lotronex 0.5 mg twice daily. She does report having to adjust to this "new normal". She went from having 10-15 bowel movements a day to now having 2-3 bowel movements per day. Stool is still soft and at times loose but not as watery. She denies constipation. Initially she felt mild increase in abdominal bloating and she decreased to once daily dosing, but slowly added and the second dose. She is now taking 0.5 mg twice daily. She reports overall her life is much more functional and she is happy with the result. She denies rectal bleeding. No abdominal pain. No further abdominal bloating. No fevers or chills. She has been able to achieve more foot she wants. Of note she has recently started tofacitinib for RA.   Review of Systems As per history of present illness, otherwise negative  Current Medications, Allergies, Past Medical History, Past Surgical History, Family History and Social History were reviewed in [employer] record.     Objective:   Physical Exam BP 122/82  Pulse 85  Ht 5\' 6"  (1.676 m)  Wt 246 lb (111.585 kg)  BMI 39.72 kg/m2  SpO2 97% Constitutional: Well-developed and well-nourished. No  distress. HEENT: Normocephalic and atraumatic.No scleral icterus. Cardiovascular: Normal rate, regular rhythm and intact distal pulses. Pulmonary/chest: Effort normal and breath sounds normal. No wheezing, rales or rhonchi. Abdominal: Soft, nontender, nondistended. Bowel sounds active throughout. There are no masses palpable. Extremities: no clubbing, cyanosis, or edema Neurological: Alert and oriented to person place and time. Skin: Skin is warm and dry. No rashes noted. Psychiatric: Normal mood and affect. Behavior is normal.     Assessment & Plan:  37 year old female with a past medical history significant for bipolar depression with rapid cycling, migraine, rheumatoid arthritis on methotrexate and intermittently adalimumab, and irritable bowel syndrome-D who is seen in followup.   1.  IBS-D -- significant improvement with Lotronex 0.5 mg twice daily. We will continue at this dose for now. I have again reminded her that should she develop abdominal pain, constipation, or rectal bleeding that she stop the medication and notify me immediately.  She voices understanding. I would like to see her back in 6 months for reevaluation. She is to call me if anything changes before this visit.

## 2013-06-11 NOTE — Patient Instructions (Addendum)
You have been given a separate informational sheet regarding your tobacco use, the importance of quitting and local resources to help you quit.   We have sent the following medications to your pharmacy for you to pick up at your convenience: Lotronex  Follow up with Dr. Rhea Belton in office in 6 months  Call us if you have sever abdominal pain or rectal bleeding                                               We are excited to introduce MyChart, a new best-in-class service that provides you online access to important information in your electronic medical record. We want to make it easier for you to view your health information - all in one secure location - when and where you need it. We expect MyChart will enhance the quality of care and service we provide.  When you register for MyChart, you can:    View your test results.    Request appointments and receive appointment reminders via email.    Request medication renewals.    View your medical history, allergies, medications and immunizations.    Communicate with your physician's office through a password-protected site.    Conveniently print information such as your medication lists.  To find out if MyChart is right for you, please talk to a member of our clinical staff today. We will gladly answer your questions about this free health and wellness tool.  If you are age 38 or older and want a member of your family to have access to your record, you must provide written consent by completing a proxy form available at our office. Please speak to our clinical staff about guidelines regarding accounts for patients younger than age 26.  As you activate your MyChart account and need any technical assistance, please call the MyChart technical support line at [phone]-CHART [phone]) or email your question to [email or web address]. If you email your question(s), please include your name, a return phone number and the best time to reach  you.  If you have non-urgent health-related questions, you can send a message to our office through MyChart at [email or web address]. If you have a medical emergency, call 911.  Thank you for using MyChart as your new health and wellness resource!   MyChart licensed from [employer],  [id number]. Patents Pending.

## 2013-06-12 ENCOUNTER — Encounter: Payer: BC Managed Care – PPO | Admitting: Family Medicine

## 2013-06-17 ENCOUNTER — Telehealth: Payer: Self-pay | Admitting: Family Medicine

## 2013-06-17 NOTE — Telephone Encounter (Signed)
Spoke with pt states that her rash has gotten much worse and is on her head. Pt scheduled with Tabori on 9/10.

## 2013-06-17 NOTE — Telephone Encounter (Signed)
Patient came in to see Dr. Drue Novel for an itchy rash on 05/29/13 and states that the rash has gotten worse even after using rx. She was told to call if it had not gotten better in 10 days. Please advise.

## 2013-06-19 ENCOUNTER — Ambulatory Visit (INDEPENDENT_AMBULATORY_CARE_PROVIDER_SITE_OTHER): Payer: BC Managed Care – PPO | Admitting: Family Medicine

## 2013-06-19 ENCOUNTER — Encounter: Payer: Self-pay | Admitting: Family Medicine

## 2013-06-19 VITALS — BP 122/76 | HR 80 | Temp 98.1°F | Ht 66.0 in | Wt 244.2 lb

## 2013-06-19 DIAGNOSIS — F17200 Nicotine dependence, unspecified, uncomplicated: Secondary | ICD-10-CM

## 2013-06-19 DIAGNOSIS — Z87891 Personal history of nicotine dependence: Secondary | ICD-10-CM | POA: Insufficient documentation

## 2013-06-19 DIAGNOSIS — L989 Disorder of the skin and subcutaneous tissue, unspecified: Secondary | ICD-10-CM

## 2013-06-19 MED ORDER — CLOBETASOL PROPIONATE 0.05 % EX SHAM: 1.0000 "application " | MEDICATED_SHAMPOO | Freq: Every day | CUTANEOUS | Status: DC

## 2013-06-19 NOTE — Progress Notes (Signed)
  Subjective:    Patient ID: [name], female    DOB: [birth year], 37 y.o.   MRN: [id number]  HPI Rash on scalp- was seen by Dr Drue Novel on 8/20 and was given triamcinolone lotion.  Pt feels sxs are worsening.  Increased pain, wider distribution, drainage at night when lying down.  No fevers.  Hx of RA.  Using Head and Shoulders to try and combat the itching.  No new hair products.  Tobacco use- pt is very interested in smoking cessation   Review of Systems For ROS see HPI     Objective:   Physical Exam  Vitals reviewed. Constitutional: She is oriented to person, place, and time. She appears well-developed and well-nourished. No distress.  HENT:  Head: Normocephalic and atraumatic.  Lymphadenopathy:    She has no cervical adenopathy.  Neurological: She is alert and oriented to person, place, and time.  Skin: Skin is warm and dry.  Scattered pustules and small ulcerations on scalp, +TTP  Psychiatric: She has a normal mood and affect. Her behavior is normal.          Assessment & Plan:

## 2013-06-19 NOTE — Assessment & Plan Note (Signed)
New to provider.  Kenalog lotion ineffective.  Start Clobex shampoo.  Given pt's hx of autoimmune disease (RA), will refer to derm for complete evaluation and tx.  Pt expressed understanding and is in agreement w/ plan.

## 2013-06-19 NOTE — Assessment & Plan Note (Signed)
Pt very interested in quitting.  Information on classes and resources given.

## 2013-06-19 NOTE — Patient Instructions (Addendum)
Use the Clobex shampoo daily for the first week and then decrease to 3x/week We'll call you with your derm appt Schedule your smoking cessation classes Call with any questions or concerns Hang in there!!

## 2013-08-02 ENCOUNTER — Telehealth: Payer: Self-pay

## 2013-08-02 NOTE — Telephone Encounter (Signed)
Medication and allergies: reviewed and updated  90 day supply/mail order: na Local pharmacy: [facility] [address] [employer] [facility]    Immunizations due:  Tdap  A/P:   No changes to [facility], [facility] Has gyn-no longer gets pap CSS-03/2013 neg; next one at 49 Flu vaccine UTD 07/24/2013  To Discuss with Provider: Nothing at this time

## 2013-08-06 ENCOUNTER — Encounter: Payer: Self-pay | Admitting: Family Medicine

## 2013-08-06 ENCOUNTER — Ambulatory Visit (INDEPENDENT_AMBULATORY_CARE_PROVIDER_SITE_OTHER): Payer: BC Managed Care – PPO | Admitting: Family Medicine

## 2013-08-06 VITALS — BP 112/80 | HR 94 | Temp 98.2°F | Resp 16 | Ht 66.5 in | Wt 248.5 lb

## 2013-08-06 DIAGNOSIS — Z0000 Encounter for general adult medical examination without abnormal findings: Secondary | ICD-10-CM

## 2013-08-06 DIAGNOSIS — Z23 Encounter for immunization: Secondary | ICD-10-CM

## 2013-08-06 DIAGNOSIS — Z20828 Contact with and (suspected) exposure to other viral communicable diseases: Secondary | ICD-10-CM

## 2013-08-06 LAB — BASIC METABOLIC PANEL
BUN: 10 mg/dL (ref 6–23)
Calcium: 9.4 mg/dL (ref 8.4–10.5)
Creatinine, Ser: 0.6 mg/dL (ref 0.4–1.2)
GFR: 114.24 mL/min (ref >60.00)

## 2013-08-06 LAB — CBC WITH DIFFERENTIAL/PLATELET
Basophils Absolute: 0 10*3/uL (ref 0.0–0.1)
Basophils Relative: 0.5 % (ref 0.0–3.0)
Eosinophils Absolute: 0.1 10*3/uL (ref 0.0–0.7)
Hemoglobin: 13.9 g/dL (ref 12.0–15.0)
Lymphocytes Relative: 25.3 % (ref 12.0–46.0)
Lymphs Abs: 1.9 10*3/uL (ref 0.7–4.0)
MCHC: 34.5 g/dL (ref 30.0–36.0)
MCV: 92.9 fl (ref 78.0–100.0)
Monocytes Absolute: 0.5 10*3/uL (ref 0.1–1.0)
Neutro Abs: 4.9 10*3/uL (ref 1.4–7.7)
Neutrophils Relative %: 66.1 % (ref 43.0–77.0)
RBC: 4.34 Mil/uL (ref 3.87–5.11)
RDW: 13.3 % (ref 11.5–14.6)

## 2013-08-06 LAB — LDL CHOLESTEROL, DIRECT: Direct LDL: 147.8 mg/dL

## 2013-08-06 LAB — LIPID PANEL
Cholesterol: 240 mg/dL — ABNORMAL HIGH (ref 0–200)
HDL: 60.5 mg/dL (ref >39.00)
Total CHOL/HDL Ratio: 4
Triglycerides: 207 mg/dL — ABNORMAL HIGH (ref 0.0–149.0)
VLDL: 41.4 mg/dL — ABNORMAL HIGH (ref 0.0–40.0)

## 2013-08-06 LAB — HEPATIC FUNCTION PANEL
Bilirubin, Direct: 0 mg/dL (ref 0.0–0.3)
Total Bilirubin: 0.4 mg/dL (ref 0.3–1.2)

## 2013-08-06 LAB — TSH: TSH: 0.83 u[IU]/mL (ref 0.35–5.50)

## 2013-08-06 MED ORDER — PHENTERMINE HCL 37.5 MG PO CAPS: 37.5000 mg | ORAL_CAPSULE | ORAL | Status: DC

## 2013-08-06 NOTE — Patient Instructions (Signed)
Follow up in 3 months to recheck weight We'll notify you of your lab results and make any changes if needed Restart the Phentermine daily Continue to work on healthy food choices and regular activity Keep up the good work! Happy Halloween!

## 2013-08-06 NOTE — Progress Notes (Signed)
  Subjective:    Patient ID: [name], female    DOB: 04/24/1976, 37 y.o.   MRN: [id number]  HPI CPE- UTD on colonoscopy.  No longer having paps (hysterectomy).  Too young for mammo but had baseline recently.   Review of Systems Patient reports no vision/ hearing changes, adenopathy,fever, weight change,  persistant/recurrent hoarseness , swallowing issues, chest pain, palpitations, edema, persistant/recurrent cough, hemoptysis, dyspnea (rest/exertional/paroxysmal nocturnal), gastrointestinal bleeding (melena, rectal bleeding), abdominal pain, significant heartburn, bowel changes, GU symptoms (dysuria, hematuria, incontinence), Gyn symptoms (abnormal  bleeding, pain),  syncope, focal weakness, memory loss, numbness & tingling, skin/hair/nail changes, abnormal bruising or bleeding, anxiety, or depression.     Objective:   Physical Exam General Appearance:    Alert, cooperative, no distress, appears stated age  Head:    Normocephalic, without obvious abnormality, atraumatic  Eyes:    PERRL, conjunctiva/corneas clear, EOM's intact, fundi    benign, both eyes  Ears:    Normal TM's and external ear canals, both ears  Nose:   Nares normal, septum midline, mucosa normal, no drainage    or sinus tenderness  Throat:   Lips, mucosa, and tongue normal; teeth and gums normal  Neck:   Supple, symmetrical, trachea midline, no adenopathy;    Thyroid: no enlargement/tenderness/nodules  Back:     Symmetric, no curvature, ROM normal, no CVA tenderness  Lungs:     Clear to auscultation bilaterally, respirations unlabored  Chest Wall:    No tenderness or deformity   Heart:    Regular rate and rhythm, S1 and S2 normal, no murmur, rub   or gallop  Breast Exam:    Deferred to GYN  Abdomen:     Soft, non-tender, bowel sounds active all four quadrants,    no masses, no organomegaly  Genitalia:    Deferred to GYN  Rectal:    Extremities:   Extremities normal, atraumatic, no cyanosis or edema   Pulses:   2+ and symmetric all extremities  Skin:   Skin color, texture, turgor normal, no rashes or lesions  Lymph nodes:   Cervical, supraclavicular, and axillary nodes normal  Neurologic:   CNII-XII intact, normal strength, sensation and reflexes    throughout          Assessment & Plan:

## 2013-08-06 NOTE — Assessment & Plan Note (Signed)
New to provider, ongoing for pt.  Restart Phentermine as this has helped control pt's weight in the past (a lot of pt's weight gain is due to psych meds)

## 2013-08-06 NOTE — Assessment & Plan Note (Signed)
Pt's PE WNL w/ exception of obesity.  Restart Phentermine as this has helped control pt's weight in the past (a lot of pt's weight gain is due to psych meds).  UTD on health maintenance.  Check labs.  Anticipatory guidance provided.

## 2013-08-07 ENCOUNTER — Other Ambulatory Visit: Payer: Self-pay | Admitting: General Practice

## 2013-08-07 ENCOUNTER — Other Ambulatory Visit: Payer: Self-pay | Admitting: Family Medicine

## 2013-08-07 DIAGNOSIS — E785 Hyperlipidemia, unspecified: Secondary | ICD-10-CM

## 2013-08-07 MED ORDER — ATORVASTATIN CALCIUM 20 MG PO TABS: 20.0000 mg | ORAL_TABLET | Freq: Every day | ORAL | Status: DC

## 2013-08-12 LAB — VITAMIN D 1,25 DIHYDROXY: Vitamin D3 1, 25 (OH)2: 39 pg/mL

## 2013-09-22 ENCOUNTER — Encounter: Payer: Self-pay | Admitting: Family Medicine

## 2013-09-22 DIAGNOSIS — L989 Disorder of the skin and subcutaneous tissue, unspecified: Secondary | ICD-10-CM

## 2013-09-24 ENCOUNTER — Telehealth: Payer: Self-pay | Admitting: *Deleted

## 2013-09-24 NOTE — Telephone Encounter (Signed)
Patient returned call and stated that she will just continue to watch the lump and if anything is to change she will notify the office.

## 2013-09-24 NOTE — Telephone Encounter (Signed)
Patient called and stated that she has a small lump (size of a fingernail) on he reg. Patient states that she has an appointment with the dermatologist on Jan.7th. Patient states that the lump is not in the same area of the leg. Patient denies any swelling, itchiness, or it being painful. Patient would like to know if this is something you would like to look at or should she wait until her appointment.? Please advise. SW

## 2013-09-24 NOTE — Telephone Encounter (Signed)
Pt has 2 options- we could see her here and determine if it's anything to show the dermatologist or if she wants to wait until her appt in January b/c it's not painful or causing problems that's reasonable too

## 2013-09-24 NOTE — Telephone Encounter (Signed)
Called pt and LMOVM to return call.  °

## 2013-09-27 ENCOUNTER — Other Ambulatory Visit: Payer: Self-pay | Admitting: General Practice

## 2013-09-27 DIAGNOSIS — M25561 Pain in right knee: Secondary | ICD-10-CM

## 2013-10-02 ENCOUNTER — Ambulatory Visit (INDEPENDENT_AMBULATORY_CARE_PROVIDER_SITE_OTHER): Payer: BC Managed Care – PPO | Admitting: Internal Medicine

## 2013-10-02 ENCOUNTER — Encounter: Payer: Self-pay | Admitting: Internal Medicine

## 2013-10-02 VITALS — BP 124/79 | HR 97 | Temp 98.5°F | Wt 253.0 lb

## 2013-10-02 DIAGNOSIS — B349 Viral infection, unspecified: Secondary | ICD-10-CM

## 2013-10-02 DIAGNOSIS — R05 Cough: Secondary | ICD-10-CM

## 2013-10-02 DIAGNOSIS — B9789 Other viral agents as the cause of diseases classified elsewhere: Secondary | ICD-10-CM

## 2013-10-02 DIAGNOSIS — R059 Cough, unspecified: Secondary | ICD-10-CM

## 2013-10-02 LAB — POCT INFLUENZA A/B
Influenza A, POC: NEGATIVE
Influenza B, POC: NEGATIVE

## 2013-10-02 NOTE — Progress Notes (Signed)
Pre visit review using our clinic review tool, if applicable. No additional management support is needed unless otherwise documented below in the visit note. 

## 2013-10-02 NOTE — Patient Instructions (Signed)
Rest, fluids , tylenol For cough, take Mucinex DM twice a day as needed  For congestion use OTC Nasocort: 2 nasal sprays on each side of the nose daily until you feel better Albuterol as needed for persistent  cough or wheezing Call if no better in few days Call anytime if the symptoms are severe

## 2013-10-02 NOTE — Progress Notes (Signed)
   Subjective:    Patient ID: [name], female    DOB: 05-15-1976, 37 y.o.   MRN: [id number]  HPI Acute visit Yesterday  developed mild cough and postnasal dripping. Today she's not feeling well: Achy mostly on the shoulders, head congestion, sinus pressure. Her mom was diagnosed with the flu and she likes to be sure she does not have. She did have a flu shot.  Past Medical History  Diagnosis Date  . Migraine   . Alcoholism   . Arthritis   . Asthma   . Depression   . GERD (gastroesophageal reflux disease)   . IBS (irritable bowel syndrome)   . Allergy   . Anemia   . Anxiety    Past Surgical History  Procedure Laterality Date  . Cervical fusion    . Cholecystectomy    . Laparoscopic hysterectomy    . Tonsillectomy    . Inguinal hernai repair       Review of Systems No fever or chills Denies nausea, vomiting, diarrhea fever No sputum production No rash. She has a history of asthma and she has been feeling slightly wheazzy.     Objective:   Physical Exam BP 124/79  Pulse 97  Temp(Src) 98.5 F (36.9 C)  Wt 253 lb (114.76 kg)  SpO2 93% General -- alert, well-developed, NAD.    HEENT-- Not pale. TMs normal, throat symmetric, no redness or discharge. Face symmetric, sinuses not tender to palpation. Nose slt congested. Lungs -- normal respiratory effort, no intercostal retractions, no accessory muscle use, and normal breath sounds. No wheezing  Heart-- normal rate, regular rhythm, no murmur.  Neurologic--  alert & oriented X3. Speech normal, gait normal. Psych-- Cognition and judgment appear intact. Cooperative with normal attention span and concentration. No anxious appearing , no depressed appearing.      Assessment & Plan:   Early viral syndrome?  Flu test is negative, I recommend conservative treatment. She does have albuterol and recommend to use as needed for wheezing or cough

## 2013-10-04 ENCOUNTER — Encounter: Payer: Self-pay | Admitting: Internal Medicine

## 2013-10-07 ENCOUNTER — Encounter: Payer: Self-pay | Admitting: Family Medicine

## 2013-10-14 ENCOUNTER — Ambulatory Visit
Admission: RE | Admit: 2013-10-14 | Discharge: 2013-10-14 | Disposition: A | Discharge status: Home / Self Care | Payer: BC Managed Care – PPO | Source: Ambulatory Visit | Attending: Rheumatology | Admitting: Rheumatology

## 2013-10-14 DIAGNOSIS — M25561 Pain in right knee: Secondary | ICD-10-CM

## 2013-11-08 ENCOUNTER — Encounter: Payer: Self-pay | Admitting: Family Medicine

## 2013-11-08 ENCOUNTER — Ambulatory Visit (INDEPENDENT_AMBULATORY_CARE_PROVIDER_SITE_OTHER): Payer: Self-pay | Admitting: Family Medicine

## 2013-11-08 VITALS — BP 120/78 | HR 86 | Temp 98.2°F | Resp 16 | Wt 251.4 lb

## 2013-11-08 DIAGNOSIS — G43709 Chronic migraine without aura, not intractable, without status migrainosus: Secondary | ICD-10-CM

## 2013-11-08 DIAGNOSIS — E669 Obesity, unspecified: Secondary | ICD-10-CM

## 2013-11-08 DIAGNOSIS — E785 Hyperlipidemia, unspecified: Secondary | ICD-10-CM

## 2013-11-08 DIAGNOSIS — IMO0001 Reserved for inherently not codable concepts without codable children: Secondary | ICD-10-CM

## 2013-11-08 DIAGNOSIS — IMO0002 Reserved for concepts with insufficient information to code with codable children: Secondary | ICD-10-CM

## 2013-11-08 LAB — LIPID PANEL
CHOLESTEROL: 168 mg/dL (ref 0–200)
HDL: 70.5 mg/dL (ref >39.00)
LDL Cholesterol: 86 mg/dL (ref 0–99)
TRIGLYCERIDES: 56 mg/dL (ref 0.0–149.0)
Total CHOL/HDL Ratio: 2
VLDL: 11.2 mg/dL (ref 0.0–40.0)

## 2013-11-08 LAB — HEPATIC FUNCTION PANEL
ALT: 18 U/L (ref 0–35)
AST: 13 U/L (ref 0–37)
Albumin: 3.9 g/dL (ref 3.5–5.2)
Alkaline Phosphatase: 78 U/L (ref 39–117)
Bilirubin, Direct: 0 mg/dL (ref 0.0–0.3)
Total Bilirubin: 0.6 mg/dL (ref 0.3–1.2)
Total Protein: 7.1 g/dL (ref 6.0–8.3)

## 2013-11-08 MED ORDER — SUMATRIPTAN SUCCINATE 50 MG PO TABS: 50.0000 mg | ORAL_TABLET | ORAL | Status: DC | PRN

## 2013-11-08 NOTE — Patient Instructions (Signed)
Follow up in 4-6 months to recheck weight loss We'll call you with your nutrition appt We'll notify you of your lab results and make any changes if needed Keep up the good work on the exercise- you can do this!! Call with any questions or concerns Congrats on the new job!

## 2013-11-08 NOTE — Progress Notes (Signed)
Pre visit review using our clinic review tool, if applicable. No additional management support is needed unless otherwise documented below in the visit note. 

## 2013-11-08 NOTE — Progress Notes (Signed)
   Subjective:    Patient ID: [name], female    DOB: 09/13/1976, 38 y.o.   MRN: [id number]  HPI Obesity- pt has lost 2 lbs since last visit.  Taking phentermine 'a couple times a week and usually 1/2' bc 'it amps me up'.  Has not seen nutrition.  Has recently started exercising- walking longer w/ the dog.  Otherwise feeling well.  Hyperlipidemia- pt was started on Lipitor in Oct.  Did not return for LFTs.  Due for repeat labs.  Taking regularly.  Migraine- pt typically follows w/ neuro but due to new job is unable to get time off to see them and they are requiring appt before refilling imitrex   Review of Systems For ROS see HPI     Objective:   Physical Exam  Vitals reviewed. Constitutional: She is oriented to person, place, and time. She appears well-developed and well-nourished. No distress.  obese  HENT:  Head: Normocephalic and atraumatic.  Eyes: Conjunctivae and EOM are normal. Pupils are equal, round, and reactive to light.  Neck: Normal range of motion. Neck supple. No thyromegaly present.  Cardiovascular: Normal rate, regular rhythm, normal heart sounds and intact distal pulses.   No murmur heard. Pulmonary/Chest: Effort normal and breath sounds normal. No respiratory distress.  Abdominal: Soft. She exhibits no distension. There is no tenderness.  Musculoskeletal: She exhibits no edema.  Lymphadenopathy:    She has no cervical adenopathy.  Neurological: She is alert and oriented to person, place, and time.  Skin: Skin is warm and dry.  Psychiatric: She has a normal mood and affect. Her behavior is normal.          Assessment & Plan:

## 2013-11-10 NOTE — Assessment & Plan Note (Signed)
Unchanged.  Pt is taking phentermine sporadically due to impact on bipolar disorder.  Will refer pt to nutritionist.  Encouraged her to get regular exercise.  Will follow.

## 2013-11-10 NOTE — Assessment & Plan Note (Signed)
Noted at last visit.  Pt started on statin but did not return for f/u labs.  Check labs today and adjust meds prn.

## 2013-11-13 ENCOUNTER — Telehealth: Payer: Self-pay | Admitting: Family Medicine

## 2013-11-13 NOTE — Telephone Encounter (Signed)
Relevant patient education assigned to patient using Emmi. ° °

## 2013-12-12 ENCOUNTER — Ambulatory Visit: Payer: Self-pay | Admitting: Dietician

## 2014-02-19 ENCOUNTER — Encounter: Payer: Self-pay | Admitting: Family Medicine

## 2014-02-19 MED ORDER — HYDROCORTISONE ACE-PRAMOXINE 2.5-1 % RE CREA: 1.0000 "application " | TOPICAL_CREAM | Freq: Three times a day (TID) | RECTAL | Status: DC

## 2014-02-19 NOTE — Telephone Encounter (Signed)
Medication filled to [facility] on [address]. Pt notified.

## 2014-03-12 ENCOUNTER — Encounter: Payer: Self-pay | Admitting: Family Medicine

## 2014-03-12 ENCOUNTER — Ambulatory Visit (INDEPENDENT_AMBULATORY_CARE_PROVIDER_SITE_OTHER): Payer: BC Managed Care – PPO | Admitting: Family Medicine

## 2014-03-12 VITALS — BP 122/78 | HR 82 | Temp 98.2°F | Resp 16 | Wt 248.4 lb

## 2014-03-12 DIAGNOSIS — R5381 Other malaise: Secondary | ICD-10-CM

## 2014-03-12 DIAGNOSIS — M545 Low back pain, unspecified: Secondary | ICD-10-CM | POA: Insufficient documentation

## 2014-03-12 DIAGNOSIS — R5383 Other fatigue: Principal | ICD-10-CM

## 2014-03-12 LAB — HEPATIC FUNCTION PANEL
ALBUMIN: 3.8 g/dL (ref 3.5–5.2)
ALT: 13 U/L (ref 0–35)
AST: 13 U/L (ref 0–37)
Alkaline Phosphatase: 73 U/L (ref 39–117)
BILIRUBIN TOTAL: 0.4 mg/dL (ref 0.2–1.2)
Bilirubin, Direct: 0 mg/dL (ref 0.0–0.3)
Total Protein: 6.7 g/dL (ref 6.0–8.3)

## 2014-03-12 LAB — BASIC METABOLIC PANEL
BUN: 12 mg/dL (ref 6–23)
CO2: 29 mEq/L (ref 19–32)
CREATININE: 0.6 mg/dL (ref 0.4–1.2)
Calcium: 8.9 mg/dL (ref 8.4–10.5)
Chloride: 103 mEq/L (ref 96–112)
GFR: 109.78 mL/min (ref >60.00)
Glucose, Bld: 77 mg/dL (ref 70–99)
Potassium: 3.9 mEq/L (ref 3.5–5.1)
Sodium: 137 mEq/L (ref 135–145)

## 2014-03-12 LAB — CBC WITH DIFFERENTIAL/PLATELET
Basophils Absolute: 0 10*3/uL (ref 0.0–0.1)
Basophils Relative: 0.5 % (ref 0.0–3.0)
EOS PCT: 1 % (ref 0.0–5.0)
Eosinophils Absolute: 0.1 10*3/uL (ref 0.0–0.7)
HEMATOCRIT: 36.4 % (ref 36.0–46.0)
HEMOGLOBIN: 12.4 g/dL (ref 12.0–15.0)
LYMPHS ABS: 2.1 10*3/uL (ref 0.7–4.0)
Lymphocytes Relative: 28.6 % (ref 12.0–46.0)
MCHC: 34.2 g/dL (ref 30.0–36.0)
MCV: 94.6 fl (ref 78.0–100.0)
Monocytes Absolute: 0.4 10*3/uL (ref 0.1–1.0)
Monocytes Relative: 5.9 % (ref 3.0–12.0)
NEUTROS ABS: 4.6 10*3/uL (ref 1.4–7.7)
Neutrophils Relative %: 64 % (ref 43.0–77.0)
Platelets: 270 10*3/uL (ref 150.0–400.0)
RBC: 3.85 Mil/uL — ABNORMAL LOW (ref 3.87–5.11)
RDW: 13.1 % (ref 11.5–15.5)
WBC: 7.2 10*3/uL (ref 4.0–10.5)

## 2014-03-12 LAB — B12 AND FOLATE PANEL
Folate: 8.8 ng/mL (ref >5.9)
Vitamin B-12: 221 pg/mL (ref 211–911)

## 2014-03-12 LAB — TSH: TSH: 0.68 u[IU]/mL (ref 0.35–4.50)

## 2014-03-12 NOTE — Patient Instructions (Signed)
Follow up in 1-2 weeks to recheck fatigue and other symptoms Heat for your lower back Tylenol/ibuprofen as needed for body aches We'll notify you of your lab results and make any changes if needed Please call if anything changes or worsens HANG IN THERE!!!  We'll figure this out!

## 2014-03-12 NOTE — Progress Notes (Signed)
   Subjective:    Patient ID: [name], female    DOB: [birth year], 38 y.o.   MRN: [id number]  HPI 'overall unwell'- 'i feel tingly and weak over the last month or so'.  Has been trying to lose weight, exercising regularly.  'in the last week and a half, things have just plummeted'.  Eating better, drinking 'a lot of water'.  Now having L sided low back/buttock pain and notes a 'knot'.  Admits to some depression but states that her physical symptoms are very bothersome.  Having difficulty w/ memory, some light headedness.  Alternating diarrhea/constipation- 'it is not normal for me to have any constipation'.  Also having hot flashes.   Review of Systems For ROS see HPI     Objective:   Physical Exam  Vitals reviewed. Constitutional: She is oriented to person, place, and time. She appears well-developed and well-nourished. No distress.  HENT:  Head: Normocephalic and atraumatic.  Neck: Normal range of motion. Neck supple. No thyromegaly present.  Cardiovascular: Normal rate, regular rhythm, normal heart sounds and intact distal pulses.   Pulmonary/Chest: Effort normal and breath sounds normal. No respiratory distress. She has no wheezes. She has no rales.  Abdominal: Soft. Bowel sounds are normal. She exhibits no distension. There is no tenderness. There is no rebound.  Musculoskeletal:  + TTP over L SI joint w/ overlying small, freely mobile soft tissue mass seemingly below skin in fatty tissue.  Neurological: She is alert and oriented to person, place, and time. No cranial nerve deficit. Coordination normal.  (-) SLR bilaterally  Psychiatric:  Flat affect, withdrawn          Assessment & Plan:

## 2014-03-12 NOTE — Assessment & Plan Note (Signed)
New.  Pt is very flat today.  Reports her physical state is worsening her mood.  No obvious cause of fatigue identified on PE.  Check labs to r/o metabolic or infectious process (including tegretol toxicity).  Will follow closely.

## 2014-03-12 NOTE — Progress Notes (Signed)
Pre visit review using our clinic review tool, if applicable. No additional management support is needed unless otherwise documented below in the visit note. 

## 2014-03-12 NOTE — Assessment & Plan Note (Signed)
New to provider.  Pt w/ hx of RA.  On flexeril.  Encouraged OTC tylenol and ibuprofen for pain relief.  Heating pad prn.  Small soft tissue mass at L SI joint more consistent w/ lipoma than musculoskeletal issue.  Will follow.

## 2014-03-13 ENCOUNTER — Encounter: Payer: Self-pay | Admitting: Family Medicine

## 2014-03-13 LAB — CARBAMAZEPINE LEVEL, TOTAL: Carbamazepine Lvl: 11.1 ug/mL (ref 4.0–12.0)

## 2014-03-17 ENCOUNTER — Encounter: Payer: Self-pay | Admitting: Family Medicine

## 2014-03-20 ENCOUNTER — Ambulatory Visit (INDEPENDENT_AMBULATORY_CARE_PROVIDER_SITE_OTHER): Payer: BC Managed Care – PPO | Admitting: Family Medicine

## 2014-03-20 ENCOUNTER — Encounter: Payer: Self-pay | Admitting: Family Medicine

## 2014-03-20 VITALS — BP 118/68 | HR 86 | Temp 98.2°F | Resp 16 | Wt 247.0 lb

## 2014-03-20 DIAGNOSIS — M545 Low back pain, unspecified: Secondary | ICD-10-CM

## 2014-03-20 DIAGNOSIS — IMO0002 Reserved for concepts with insufficient information to code with codable children: Secondary | ICD-10-CM

## 2014-03-20 DIAGNOSIS — R5383 Other fatigue: Secondary | ICD-10-CM

## 2014-03-20 DIAGNOSIS — G43709 Chronic migraine without aura, not intractable, without status migrainosus: Secondary | ICD-10-CM

## 2014-03-20 DIAGNOSIS — R5381 Other malaise: Secondary | ICD-10-CM

## 2014-03-20 MED ORDER — MELOXICAM 15 MG PO TABS: 15.0000 mg | ORAL_TABLET | Freq: Every day | ORAL | Status: DC

## 2014-03-20 NOTE — Assessment & Plan Note (Signed)
Recurrent problem for pt.  Encouraged her to get HEP from PT (has appt next week) and to use daily anti-inflammatory prn.  Reviewed supportive care and red flags that should prompt return.  Pt expressed understanding and is in agreement w/ plan.

## 2014-03-20 NOTE — Patient Instructions (Signed)
Schedule your complete physical in October Start the Meloxicam for the inflammation/back pain Follow up with the PT and see if you can get a home exercise program for core stabilization If the migraines change or worsen, please call me Call with any questions or concerns Have a great weekend!!!

## 2014-03-20 NOTE — Progress Notes (Signed)
   Subjective:    Patient ID: [name], female    DOB: 1976/04/26, 38 y.o.   MRN: [id number]  HPI Fatigue- pt reports sxs are improving.  Is decreasing Tegretol dose- decreasing from 800 --> 400.  Pt also feeling better b/c she feels she is being heard by psych.  Migraines- using Imitrex more frequently over last 2-3 months.  Is unable to take topamax due to previous reaction.  Thinks she has Propranolol available at home.  LBP- chronic problem, asking if she should start HEP, PT, or just rely on meds.  Review of Systems For ROS see HPI     Objective:   Physical Exam  Vitals reviewed. Constitutional: She is oriented to person, place, and time. She appears well-developed and well-nourished. No distress.  obese  HENT:  Head: Normocephalic and atraumatic.  Neurological: She is alert and oriented to person, place, and time. No cranial nerve deficit.  Skin: Skin is warm and dry.  Psychiatric: She has a normal mood and affect. Her behavior is normal. Thought content normal.          Assessment & Plan:

## 2014-03-20 NOTE — Progress Notes (Signed)
Pre visit review using our clinic review tool, if applicable. No additional management support is needed unless otherwise documented below in the visit note. 

## 2014-03-20 NOTE — Assessment & Plan Note (Signed)
Deteriorated in the last few months.  Pt suspects this is stress related.  Discussed starting Propranolol- pt states she has this at home but is not interested in starting it.  Just wanted to alert me to the fact that she is using more Imitrex.  Will follow.

## 2014-03-20 NOTE — Assessment & Plan Note (Signed)
Improved since adjusting tegretol.  Continuing to follow w/ psych for med adjustments.  Will follow along.

## 2014-03-31 ENCOUNTER — Encounter: Payer: Self-pay | Admitting: Family Medicine

## 2014-03-31 DIAGNOSIS — M539 Dorsopathy, unspecified: Secondary | ICD-10-CM

## 2014-04-12 ENCOUNTER — Other Ambulatory Visit: Payer: Self-pay | Admitting: Family Medicine

## 2014-04-14 NOTE — Telephone Encounter (Signed)
Rx sent to the pharmacy by e-script.//AB/CMA 

## 2014-04-21 ENCOUNTER — Emergency Department (HOSPITAL_COMMUNITY): Payer: BC Managed Care – PPO

## 2014-04-21 ENCOUNTER — Encounter (HOSPITAL_COMMUNITY): Payer: Self-pay | Admitting: Emergency Medicine

## 2014-04-21 ENCOUNTER — Emergency Department (HOSPITAL_COMMUNITY)
Admission: EM | Admit: 2014-04-21 | Discharge: 2014-04-21 | Disposition: A | Discharge status: Home / Self Care | Payer: BC Managed Care – PPO | Attending: Emergency Medicine | Admitting: Emergency Medicine

## 2014-04-21 DIAGNOSIS — F3289 Other specified depressive episodes: Secondary | ICD-10-CM | POA: Diagnosis not present

## 2014-04-21 DIAGNOSIS — Z791 Long term (current) use of non-steroidal anti-inflammatories (NSAID): Secondary | ICD-10-CM | POA: Diagnosis not present

## 2014-04-21 DIAGNOSIS — M542 Cervicalgia: Secondary | ICD-10-CM | POA: Diagnosis not present

## 2014-04-21 DIAGNOSIS — F329 Major depressive disorder, single episode, unspecified: Secondary | ICD-10-CM | POA: Diagnosis not present

## 2014-04-21 DIAGNOSIS — Z79899 Other long term (current) drug therapy: Secondary | ICD-10-CM | POA: Insufficient documentation

## 2014-04-21 DIAGNOSIS — F411 Generalized anxiety disorder: Secondary | ICD-10-CM | POA: Insufficient documentation

## 2014-04-21 DIAGNOSIS — K219 Gastro-esophageal reflux disease without esophagitis: Secondary | ICD-10-CM | POA: Diagnosis not present

## 2014-04-21 DIAGNOSIS — M129 Arthropathy, unspecified: Secondary | ICD-10-CM | POA: Diagnosis not present

## 2014-04-21 DIAGNOSIS — D649 Anemia, unspecified: Secondary | ICD-10-CM | POA: Insufficient documentation

## 2014-04-21 DIAGNOSIS — F17200 Nicotine dependence, unspecified, uncomplicated: Secondary | ICD-10-CM | POA: Diagnosis not present

## 2014-04-21 DIAGNOSIS — IMO0002 Reserved for concepts with insufficient information to code with codable children: Secondary | ICD-10-CM | POA: Diagnosis not present

## 2014-04-21 DIAGNOSIS — G43909 Migraine, unspecified, not intractable, without status migrainosus: Secondary | ICD-10-CM | POA: Diagnosis not present

## 2014-04-21 DIAGNOSIS — Z9889 Other specified postprocedural states: Secondary | ICD-10-CM | POA: Insufficient documentation

## 2014-04-21 DIAGNOSIS — J45909 Unspecified asthma, uncomplicated: Secondary | ICD-10-CM | POA: Diagnosis not present

## 2014-04-21 DIAGNOSIS — Z880 Allergy status to penicillin: Secondary | ICD-10-CM | POA: Insufficient documentation

## 2014-04-21 LAB — URINALYSIS, ROUTINE W REFLEX MICROSCOPIC
Bilirubin Urine: NEGATIVE
Glucose, UA: NEGATIVE mg/dL
HGB URINE DIPSTICK: NEGATIVE
Ketones, ur: NEGATIVE mg/dL
LEUKOCYTES UA: NEGATIVE
Nitrite: NEGATIVE
Protein, ur: NEGATIVE mg/dL
Specific Gravity, Urine: 1.038 — ABNORMAL HIGH (ref 1.005–1.030)
UROBILINOGEN UA: 0.2 mg/dL (ref 0.0–1.0)
pH: 6.5 (ref 5.0–8.0)

## 2014-04-21 LAB — BASIC METABOLIC PANEL
Anion gap: 16 — ABNORMAL HIGH (ref 5–15)
BUN: 11 mg/dL (ref 6–23)
CHLORIDE: 100 meq/L (ref 96–112)
CO2: 25 mEq/L (ref 19–32)
CREATININE: 0.7 mg/dL (ref 0.50–1.10)
Calcium: 9.3 mg/dL (ref 8.4–10.5)
GFR calc Af Amer: >90 mL/min (ref >90)
GFR calc non Af Amer: >90 mL/min (ref >90)
Glucose, Bld: 85 mg/dL (ref 70–99)
Potassium: 3.8 mEq/L (ref 3.7–5.3)
Sodium: 141 mEq/L (ref 137–147)

## 2014-04-21 LAB — CBC
HEMATOCRIT: 39.2 % (ref 36.0–46.0)
HEMOGLOBIN: 13.2 g/dL (ref 12.0–15.0)
MCH: 32 pg (ref 26.0–34.0)
MCHC: 33.7 g/dL (ref 30.0–36.0)
MCV: 95.1 fL (ref 78.0–100.0)
Platelets: 284 10*3/uL (ref 150–400)
RBC: 4.12 MIL/uL (ref 3.87–5.11)
RDW: 12.7 % (ref 11.5–15.5)
WBC: 9.5 10*3/uL (ref 4.0–10.5)

## 2014-04-21 LAB — TROPONIN I: Troponin I: <0.3 ng/mL (ref <0.30)

## 2014-04-21 MED ORDER — KETOROLAC TROMETHAMINE 30 MG/ML IJ SOLN
30.0000 mg | Freq: Once | INTRAMUSCULAR | Status: AC
  Administered 2014-04-21: 30 mg via INTRAVENOUS
  Filled 2014-04-21: qty 1

## 2014-04-21 MED ORDER — ONDANSETRON HCL 4 MG/2ML IJ SOLN
4.0000 mg | Freq: Once | INTRAMUSCULAR | Status: AC
  Administered 2014-04-21: 4 mg via INTRAVENOUS
  Filled 2014-04-21: qty 2

## 2014-04-21 MED ORDER — OXYCODONE-ACETAMINOPHEN 5-325 MG PO TABS
2.0000 | ORAL_TABLET | Freq: Once | ORAL | Status: DC
  Filled 2014-04-21: qty 2

## 2014-04-21 MED ORDER — IOHEXOL 350 MG/ML SOLN
50.0000 mL | Freq: Once | INTRAVENOUS | Status: AC | PRN
  Administered 2014-04-21: 50 mL via INTRAVENOUS

## 2014-04-21 MED ORDER — GI COCKTAIL ~~LOC~~
30.0000 mL | Freq: Once | ORAL | Status: AC
  Administered 2014-04-21: 30 mL via ORAL
  Filled 2014-04-21: qty 30

## 2014-04-21 MED ORDER — DIAZEPAM 5 MG PO TABS
5.0000 mg | ORAL_TABLET | Freq: Once | ORAL | Status: AC
  Administered 2014-04-21: 5 mg via ORAL
  Filled 2014-04-21: qty 1

## 2014-04-21 MED ORDER — HYDROMORPHONE HCL PF 1 MG/ML IJ SOLN
1.0000 mg | Freq: Once | INTRAMUSCULAR | Status: AC
  Administered 2014-04-21: 1 mg via INTRAVENOUS
  Filled 2014-04-21: qty 1

## 2014-04-21 MED ORDER — DIAZEPAM 5 MG PO TABS: 5.0000 mg | ORAL_TABLET | Freq: Three times a day (TID) | ORAL | Status: DC | PRN

## 2014-04-21 MED ORDER — OXYCODONE-ACETAMINOPHEN 5-325 MG PO TABS: 1.0000 | ORAL_TABLET | Freq: Four times a day (QID) | ORAL | Status: DC | PRN

## 2014-04-21 NOTE — Telephone Encounter (Signed)
Referral placed.

## 2014-04-21 NOTE — ED Notes (Signed)
Patient returned from CT

## 2014-04-21 NOTE — ED Notes (Signed)
Patient called out for heartburn 8/10. NSR. Vitals WNL

## 2014-04-21 NOTE — ED Provider Notes (Signed)
CSN: [id number]     Arrival date & time 04/21/14  1706 History   First MD Initiated Contact with Patient 04/21/14 1728     Chief Complaint  Patient presents with  . Neurologic Problem     (Consider location/radiation/quality/duration/timing/severity/associated sxs/prior Treatment) HPI Comments: Arm heaviness starting about 3.5 hours ago. Has had neck pain for the past 2 days. Began while tensing up at a soccer game. No relief with muscle after his, methocarbamol, at home. Was seen 3 days ago for neck pain, x-ray showed a broken wire in her neck from prior cervical surgery.   Patient is a 38 y.o. female presenting with neurologic complaint. The history is provided by the patient.  Neurologic Problem This is a new problem. The current episode started 3 to 5 hours ago. The problem occurs constantly. The problem has not changed since onset.Pertinent negatives include no chest pain, no abdominal pain and no shortness of breath. Nothing aggravates the symptoms. Nothing relieves the symptoms.    Past Medical History  Diagnosis Date  . Migraine   . Alcoholism   . Arthritis   . Asthma   . Depression   . GERD (gastroesophageal reflux disease)   . IBS (irritable bowel syndrome)   . Allergy   . Anemia   . Anxiety    Past Surgical History  Procedure Laterality Date  . Cervical fusion    . Cholecystectomy    . Laparoscopic hysterectomy    . Tonsillectomy    . Inguinal hernai repair     Family History  Problem Relation Age of Onset  . Breast cancer Paternal Grandmother   . Heart disease Maternal Grandfather   . Colon cancer Neg Hx   . Esophageal cancer Neg Hx   . Rectal cancer Neg Hx   . Stomach cancer Neg Hx    History  Substance Use Topics  . Smoking status: Current Every Day Smoker -- 0.50 packs/day    Types: Cigarettes  . Smokeless tobacco: Never Used  . Alcohol Use: Yes     Comment: in recovery   OB History   Grav Para Term Preterm Abortions TAB SAB Ect Mult Living                  Review of Systems  Constitutional: Negative for fever and chills.  Respiratory: Negative for cough and shortness of breath.   Cardiovascular: Negative for chest pain.  Gastrointestinal: Negative for abdominal pain.  All other systems reviewed and are negative.     Allergies  Penicillins  Home Medications   Prior to Admission medications   Medication Sig Start Date End Date Taking? Authorizing Provider  ALPRAZolam Duanne Moron) 1 MG tablet Take 1 mg by mouth as needed.     Historical Provider, MD  atorvastatin (LIPITOR) 20 MG tablet TAKE 1 TABLET (20 MG TOTAL) BY MOUTH DAILY.    Midge Minium, MD  carbamazepine (EQUETRO) 200 MG CP12 Take 400 mg by mouth daily.    Historical Provider, MD  Clobetasol Propionate 0.05 % shampoo Apply 1 application topically daily. 06/19/13   Midge Minium, MD  Cyanocobalamin (VITAMIN B-12) 1000 MCG/15ML LIQD Take 15 mLs by mouth every 30 (thirty) days.    Historical Provider, MD  cyclobenzaprine (FLEXERIL) 5 MG tablet Take 1 tablet (5 mg total) by mouth 3 (three) times daily as needed for muscle spasms. 04/10/13   Timoteo Gaul, FNP  diphenoxylate-atropine (LOMOTIL) 2.5-0.025 MG per tablet Take 1 tablet by mouth 4 (four) times  daily as needed for diarrhea or loose stools. 02/26/13   Jerene Bears, MD  estradiol (ESTRACE) 1 MG tablet Take 1 mg by mouth at bedtime.    Historical Provider, MD  folic acid (FOLVITE) 1 MG tablet Take 1 mg by mouth at bedtime.    Historical Provider, MD  hydrocortisone-pramoxine [facility]) 2.5-1 % rectal cream Place 1 application rectally 3 (three) times daily. 02/19/14   Midge Minium, MD  lamoTRIgine (LAMICTAL) 200 MG tablet Take 200 mg by mouth at bedtime.    Historical Provider, MD  meloxicam (MOBIC) 15 MG tablet Take 1 tablet (15 mg total) by mouth daily. 03/20/14   Midge Minium, MD  methotrexate (RHEUMATREX) 2.5 MG tablet Take 2.5 mg by mouth once a week. Caution:Chemotherapy. Protect from light.     Historical Provider, MD  omeprazole (PRILOSEC) 40 MG capsule Take 1 capsule (40 mg total) by mouth daily. 02/26/13   Jerene Bears, MD  phentermine 37.5 MG capsule Take 1 capsule (37.5 mg total) by mouth every morning. 08/06/13   Midge Minium, MD  sulindac (CLINORIL) 200 MG tablet  06/17/13   Historical Provider, MD  SUMAtriptan (IMITREX) 50 MG tablet Take 1 tablet (50 mg total) by mouth every 2 (two) hours as needed for migraine. 11/08/13   Midge Minium, MD   BP 152/95  Pulse 90  Temp(Src) 97.8 F (36.6 C) (Oral)  Resp 20  Wt 248 lb 7 oz (112.691 kg)  SpO2 97% Physical Exam  Nursing note and vitals reviewed. Constitutional: She is oriented to person, place, and time. She appears well-developed and well-nourished. No distress.  HENT:  Head: Normocephalic and atraumatic.  Mouth/Throat: Oropharynx is clear and moist. No oropharyngeal exudate.  Eyes: EOM are normal. Pupils are equal, round, and reactive to light.  Neck: Normal range of motion. Neck supple. No tracheal deviation present.  No bruits  Cardiovascular: Normal rate and regular rhythm.  Exam reveals no friction rub.   No murmur heard. Pulmonary/Chest: Effort normal and breath sounds normal. No stridor. No respiratory distress. She has no wheezes. She has no rales.  Abdominal: Soft. She exhibits no distension. There is no tenderness. There is no rebound.  Musculoskeletal: She exhibits no edema.       Cervical back: She exhibits decreased range of motion. She exhibits no tenderness, no bony tenderness, no pain, no spasm and normal pulse.  Neurological: She is alert and oriented to person, place, and time.  Skin: No rash noted. She is not diaphoretic.    ED Course  Procedures (including critical care time) Labs Review Labs Reviewed  CBC  BASIC METABOLIC PANEL  TROPONIN I  URINALYSIS, ROUTINE W REFLEX MICROSCOPIC    Imaging Review Ct Angio Head W/cm &/or Wo Cm  04/21/2014   CLINICAL DATA:  38 year old female  with right-sided neck pain. Abnormal sensation right side of the body. Initial encounter. History of prior cervical spine surgery.  EXAM: CT ANGIOGRAPHY HEAD AND NECK  TECHNIQUE: Multidetector CT imaging of the head and neck was performed using the standard protocol during bolus administration of intravenous contrast. Multiplanar CT image reconstructions and MIPs were obtained to evaluate the vascular anatomy. Carotid stenosis measurements (when applicable) are obtained utilizing NASCET criteria, using the distal internal carotid diameter as the denominator.  CONTRAST:  53mL OMNIPAQUE IOHEXOL 350 MG/ML SOLN  COMPARISON:  Head and cervical spine CT 09/29/2010.  FINDINGS: CTA HEAD FINDINGS  Calvarium intact. Visualized scalp soft tissues are within normal limits. Cavum  septum pellucidum again noted. No ventriculomegaly. No midline shift, mass effect, evidence of mass lesion, intracranial hemorrhage or evidence of cortically based acute infarction. Gray-white matter differentiation is within normal limits throughout the brain. Normal cerebral volume. No abnormal enhancement identified.  VASCULAR FINDINGS:  Codominant distal vertebral arteries are normal. Normal PICA origins. Normal vertebrobasilar junction. Normal basilar artery. Normal MCA and PCA origins. Posterior communicating arteries are diminutive or absent. Normal bilateral PCA branches.  Normal right ICA siphon. Normal right ophthalmic artery origin. Normal left ICA siphons and left ophthalmic artery origin. Normal carotid termini. Normal MCA and ACA origins. Normal anterior communicating artery with median artery of the corpus callosum. Normal bilateral ACA branches. Normal bilateral MCA branches.  Review of the MIP images confirms the above findings.  CTA NECK FINDINGS  Negative lung apices. No superior mediastinal lymphadenopathy. Negative thyroid, larynx, pharynx, parapharyngeal spaces, retropharyngeal space, sublingual space, submandibular gland, parotid  gland, and orbits. No cervical lymphadenopathy. Visualized paranasal sinuses and mastoids are clear.  Chronic reversal of cervical lordosis. Cervical spinous process cerclage wire in place at the C5-C6 level. The wire appears fractured similar in configuration to 2011. The right inferior aspect of the free edge of the wire is positioned slightly more laterally than at that time, terminating in the right erector spinae muscles (series 501, image 49). No arthrodesis. Ventricular size and configuration are within normal limits.  VASCULAR FINDINGS:  Three vessel arch configuration with no arch atherosclerosis.  Normal right CCA origin. Normal right CCA, and right carotid bifurcation. Normal cervical right ICA except for mild tortuosity. No proximal right subclavian artery stenosis. Normal right vertebral artery origin. Normal cervical right vertebral artery.  Normal left CCA origin. Normal left CCA and cervical left ICA. No proximal left subclavian artery stenosis. Normal left vertebral artery origin. Normal cervical left vertebral artery.  Review of the MIP images confirms the above findings.  IMPRESSION: 1. Negative CTA head and neck. 2. C5-C6 posterior spinous process cerclage wire is chronically fractured. Free edge on the right involves the erector spinae muscle to a greater extent than in 2011. 3.  Normal CT appearance of the brain.   Electronically Signed   By: Lars Pinks M.D.   On: 04/21/2014 19:49   Dg Chest 2 View  04/21/2014   CLINICAL DATA:  Right arm numbness.  EXAM: CHEST  2 VIEW  COMPARISON:  May 23, 2014.  FINDINGS: The heart size and mediastinal contours are within normal limits. Both lungs are clear. No pneumothorax or pleural effusion is noted. The visualized skeletal structures are unremarkable.  IMPRESSION: No acute cardiopulmonary abnormality seen.   Electronically Signed   By: Sabino Dick M.D.   On: 04/21/2014 19:08   Ct Angio Neck W/cm &/or Wo/cm  04/21/2014   CLINICAL DATA:  38 year old  female with right-sided neck pain. Abnormal sensation right side of the body. Initial encounter. History of prior cervical spine surgery.  EXAM: CT ANGIOGRAPHY HEAD AND NECK  TECHNIQUE: Multidetector CT imaging of the head and neck was performed using the standard protocol during bolus administration of intravenous contrast. Multiplanar CT image reconstructions and MIPs were obtained to evaluate the vascular anatomy. Carotid stenosis measurements (when applicable) are obtained utilizing NASCET criteria, using the distal internal carotid diameter as the denominator.  CONTRAST:  51mL OMNIPAQUE IOHEXOL 350 MG/ML SOLN  COMPARISON:  Head and cervical spine CT 09/29/2010.  FINDINGS: CTA HEAD FINDINGS  Calvarium intact. Visualized scalp soft tissues are within normal limits. Cavum septum pellucidum again noted.  No ventriculomegaly. No midline shift, mass effect, evidence of mass lesion, intracranial hemorrhage or evidence of cortically based acute infarction. Gray-white matter differentiation is within normal limits throughout the brain. Normal cerebral volume. No abnormal enhancement identified.  VASCULAR FINDINGS:  Codominant distal vertebral arteries are normal. Normal PICA origins. Normal vertebrobasilar junction. Normal basilar artery. Normal MCA and PCA origins. Posterior communicating arteries are diminutive or absent. Normal bilateral PCA branches.  Normal right ICA siphon. Normal right ophthalmic artery origin. Normal left ICA siphons and left ophthalmic artery origin. Normal carotid termini. Normal MCA and ACA origins. Normal anterior communicating artery with median artery of the corpus callosum. Normal bilateral ACA branches. Normal bilateral MCA branches.  Review of the MIP images confirms the above findings.  CTA NECK FINDINGS  Negative lung apices. No superior mediastinal lymphadenopathy. Negative thyroid, larynx, pharynx, parapharyngeal spaces, retropharyngeal space, sublingual space, submandibular gland,  parotid gland, and orbits. No cervical lymphadenopathy. Visualized paranasal sinuses and mastoids are clear.  Chronic reversal of cervical lordosis. Cervical spinous process cerclage wire in place at the C5-C6 level. The wire appears fractured similar in configuration to 2011. The right inferior aspect of the free edge of the wire is positioned slightly more laterally than at that time, terminating in the right erector spinae muscles (series 501, image 49). No arthrodesis. Ventricular size and configuration are within normal limits.  VASCULAR FINDINGS:  Three vessel arch configuration with no arch atherosclerosis.  Normal right CCA origin. Normal right CCA, and right carotid bifurcation. Normal cervical right ICA except for mild tortuosity. No proximal right subclavian artery stenosis. Normal right vertebral artery origin. Normal cervical right vertebral artery.  Normal left CCA origin. Normal left CCA and cervical left ICA. No proximal left subclavian artery stenosis. Normal left vertebral artery origin. Normal cervical left vertebral artery.  Review of the MIP images confirms the above findings.  IMPRESSION: 1. Negative CTA head and neck. 2. C5-C6 posterior spinous process cerclage wire is chronically fractured. Free edge on the right involves the erector spinae muscle to a greater extent than in 2011. 3.  Normal CT appearance of the brain.   Electronically Signed   By: Lars Pinks M.D.   On: 04/21/2014 19:49     EKG Interpretation None      MDM   Final diagnoses:  Neck pain    81F presents with neck pain and arm heaviness. Arm heaviness about 3.5 hours ago. Neck pain began yesterday. Was seen Friday for neck pain, xray showed a broken wire in her neck from prior cervical spine fusion. Tensed up yesterday while cheering at a soccer game and has had progressively worsening neck pain since. Neck pain started in the back of her neck, now full neck pain. Mild blurry vision today also.  AFVSS here. R arm  with good sensation, strength 4+/5. Normal ROM. R arm use worsened the neck pain. Reports a sensation of mild neck fullness like stangulation.  Normal cranial nerves, no concern for Horner's. Doubt stroke. I spoke with Dr. Leonel Ramsay who agreed, no need for Code Stroke, not a tPA candidate with a non-disabling deficit.  With R shoulder pain and neck pain, will check EKG and troponins, CTA of head and neck.  CT shows normal vasculature. Wire from prior surgery is protruding into musculature more so than prior. This wire is likely the cause of her pain. CTs otherwise normal. Pain improving with meds. I spoke with Neurosurgery who can have her f/u to look at the wire. Stable for  discharge. I have reviewed all labs and imaging and considered them in my medical decision making.    Osvaldo Shipper, MD 04/21/14 2142

## 2014-04-21 NOTE — ED Notes (Signed)
Patient returned from X-ray 

## 2014-04-21 NOTE — ED Notes (Signed)
Patient complaining of indigestion and neck pain. MD walden made aware.

## 2014-04-21 NOTE — Discharge Instructions (Signed)

## 2014-04-21 NOTE — ED Notes (Signed)
Presents with right sided neck pain, head pressure and right side of body "heaviness, not numbness or tingling" no facial droop or arm drift, no slurred speech. Recently had x ray of neck and was told her wire in her back was probably broken along with other c spine issues, has appointment for MRI on Friday, but today at 2 pm the right arm and leg began to feel heavy and "I hear crunching in my neck whenever I move along with pain that radiates into my back"

## 2014-04-21 NOTE — ED Notes (Signed)
MD Walden at the bedside. 

## 2014-04-21 NOTE — ED Notes (Signed)
MD Mingo Amber at the bedside. Patient made aware of the plan of care.

## 2014-05-01 ENCOUNTER — Other Ambulatory Visit: Payer: Self-pay | Admitting: Neurosurgery

## 2014-05-01 DIAGNOSIS — M542 Cervicalgia: Secondary | ICD-10-CM

## 2014-05-02 ENCOUNTER — Ambulatory Visit
Admission: RE | Admit: 2014-05-02 | Discharge: 2014-05-02 | Disposition: A | Discharge status: Home / Self Care | Payer: BC Managed Care – PPO | Source: Ambulatory Visit | Attending: Neurosurgery | Admitting: Neurosurgery

## 2014-05-02 VITALS — BP 108/68 | HR 74

## 2014-05-02 DIAGNOSIS — M542 Cervicalgia: Secondary | ICD-10-CM

## 2014-05-02 MED ORDER — ONDANSETRON HCL 4 MG/2ML IJ SOLN
4.0000 mg | Freq: Once | INTRAMUSCULAR | Status: AC
  Administered 2014-05-02: 4 mg via INTRAMUSCULAR

## 2014-05-02 MED ORDER — DIAZEPAM 5 MG PO TABS
10.0000 mg | ORAL_TABLET | Freq: Once | ORAL | Status: AC
Start: 2014-05-02 — End: 2014-05-02
  Administered 2014-05-02: 10 mg via ORAL

## 2014-05-02 MED ORDER — HYDROMORPHONE HCL PF 2 MG/ML IJ SOLN
1.5000 mg | Freq: Once | INTRAMUSCULAR | Status: AC
  Administered 2014-05-02: 1.5 mg via INTRAMUSCULAR

## 2014-05-02 MED ORDER — IOHEXOL 300 MG/ML  SOLN
9.0000 mL | Freq: Once | INTRAMUSCULAR | Status: AC | PRN
  Administered 2014-05-02: 9 mL via INTRATHECAL

## 2014-05-02 NOTE — Discharge Instructions (Signed)
Myelogram Discharge Instructions  1. Go home and rest quietly for the next 24 hours.  It is important to lie flat for the next 24 hours.  Get up only to go to the restroom.  You may lie in the bed or on a couch on your back, your stomach, your left side or your right side.  You may have one pillow under your head.  You may have pillows between your knees while you are on your side or under your knees while you are on your back.  2. DO NOT drive today.  Recline the seat as far back as it will go, while still wearing your seat belt, on the way home.  3. You may get up to go to the bathroom as needed.  You may sit up for 10 minutes to eat.  You may resume your normal diet and medications unless otherwise indicated.  Drink plenty of extra fluids today and tomorrow.  4. The incidence of a spinal headache with nausea and/or vomiting is about 5% (one in 20 patients).  If you develop a headache, lie flat and drink plenty of fluids until the headache goes away.  Caffeinated beverages may be helpful.  If you develop severe nausea and vomiting or a headache that does not go away with flat bed rest, call [phone].  5. You may resume normal activities after your 24 hours of bed rest is over; however, do not exert yourself strongly or do any heavy lifting tomorrow.  6. Call your physician for a follow-up appointment.   You may resume Imitrex on Saturday, May 03, 2014 after 11:30a.m.

## 2014-05-02 NOTE — Progress Notes (Signed)
Left message on dad's voicemail that patient's discharge time is 1330.  jkl

## 2014-05-07 ENCOUNTER — Other Ambulatory Visit: Payer: Self-pay | Admitting: Neurosurgery

## 2014-05-07 DIAGNOSIS — G971 Other reaction to spinal and lumbar puncture: Secondary | ICD-10-CM

## 2014-05-21 ENCOUNTER — Encounter: Payer: Self-pay | Admitting: Family Medicine

## 2014-05-23 ENCOUNTER — Encounter: Payer: Self-pay | Admitting: Family Medicine

## 2014-05-23 ENCOUNTER — Ambulatory Visit (INDEPENDENT_AMBULATORY_CARE_PROVIDER_SITE_OTHER): Payer: BC Managed Care – PPO | Admitting: Family Medicine

## 2014-05-23 VITALS — BP 126/70 | HR 87 | Temp 98.0°F | Resp 16 | Wt 245.1 lb

## 2014-05-23 DIAGNOSIS — E785 Hyperlipidemia, unspecified: Secondary | ICD-10-CM

## 2014-05-23 DIAGNOSIS — R5381 Other malaise: Secondary | ICD-10-CM

## 2014-05-23 DIAGNOSIS — R5383 Other fatigue: Principal | ICD-10-CM

## 2014-05-23 LAB — HEPATIC FUNCTION PANEL
ALT: 14 U/L (ref 0–35)
AST: 13 U/L (ref 0–37)
Albumin: 4 g/dL (ref 3.5–5.2)
Alkaline Phosphatase: 77 U/L (ref 39–117)
BILIRUBIN DIRECT: 0 mg/dL (ref 0.0–0.3)
Total Bilirubin: 0.4 mg/dL (ref 0.2–1.2)
Total Protein: 7.1 g/dL (ref 6.0–8.3)

## 2014-05-23 LAB — LIPID PANEL
CHOLESTEROL: 167 mg/dL (ref 0–200)
HDL: 51.9 mg/dL (ref >39.00)
LDL CALC: 88 mg/dL (ref 0–99)
NonHDL: 115.1
Total CHOL/HDL Ratio: 3
Triglycerides: 138 mg/dL (ref 0.0–149.0)
VLDL: 27.6 mg/dL (ref 0.0–40.0)

## 2014-05-23 LAB — VITAMIN B12: Vitamin B-12: 275 pg/mL (ref 211–911)

## 2014-05-23 LAB — TSH: TSH: 0.37 u[IU]/mL (ref 0.35–4.50)

## 2014-05-23 NOTE — Progress Notes (Signed)
Pre visit review using our clinic review tool, if applicable. No additional management support is needed unless otherwise documented below in the visit note. 

## 2014-05-23 NOTE — Patient Instructions (Addendum)
Schedule your complete physical in 3-4 months We'll notify you of your lab results and make any changes if needed This is likely multifactorial- stress, medication, pain Call with any questions or concerns Hang in there!!

## 2014-05-23 NOTE — Assessment & Plan Note (Signed)
Suspect this is multifactorial- stress, bipolar, neck pain, RA.  Pt has hx of low B12 but is not currently taking supplement.  Check labs.  R/o thyroid issue.  Check HIV test as pt has close contact w/ AIDS pt.  Will follow closely.

## 2014-05-23 NOTE — Progress Notes (Signed)
   Subjective:    Patient ID: [name], female    DOB: December 14, 1975, 38 y.o.   MRN: [id number]  Dizziness   Fatigue- pt reports 'i have a few things going on simultaneously'.  Pt reports being 'super hot 75% of the time'.  Will have episodes of energy followed by excessive fatigue.  This does not seem consistent w/ previous episodes of mania.  Pt does have degrative issues in neck from previous surgery- now w/ broken wire.  Seeing Neurosurg (Dr Joya Salm).  Now having UE weakness and numbness.  Pt is under large amounts of stress.  Not currently on B12 shots.  Uncle recently dx'd w/ AIDS.  Hyperlipidemia- chronic problem, tolerating statin w/o difficulty.   Review of Systems  Neurological: Positive for dizziness.   For ROS see HPI     Objective:   Physical Exam  Vitals reviewed. Constitutional: She is oriented to person, place, and time. She appears well-developed and well-nourished. No distress.  obese  HENT:  Head: Normocephalic and atraumatic.  Eyes: Conjunctivae and EOM are normal. Pupils are equal, round, and reactive to light.  Neck: Normal range of motion. Neck supple. No thyromegaly present.  Cardiovascular: Normal rate, regular rhythm, normal heart sounds and intact distal pulses.   No murmur heard. Pulmonary/Chest: Effort normal and breath sounds normal. No respiratory distress.  Abdominal: Soft. She exhibits no distension. There is no tenderness.  Musculoskeletal: She exhibits no edema.  Lymphadenopathy:    She has no cervical adenopathy.  Neurological: She is alert and oriented to person, place, and time.  Skin: Skin is warm and dry.  Psychiatric: She has a normal mood and affect. Her behavior is normal.          Assessment & Plan:

## 2014-05-23 NOTE — Assessment & Plan Note (Signed)
Chronic problem.  Tolerating statin w/o difficulty.  Check labs.  Adjust meds prn  

## 2014-05-24 LAB — HIV ANTIBODY (ROUTINE TESTING W REFLEX): HIV 1&2 Ab, 4th Generation: NONREACTIVE

## 2014-05-27 ENCOUNTER — Encounter (HOSPITAL_COMMUNITY): Payer: Self-pay | Admitting: Emergency Medicine

## 2014-05-27 ENCOUNTER — Emergency Department (HOSPITAL_COMMUNITY)
Admission: EM | Admit: 2014-05-27 | Discharge: 2014-05-28 | Disposition: A | Discharge status: Home / Self Care | Payer: BC Managed Care – PPO | Attending: Emergency Medicine | Admitting: Emergency Medicine

## 2014-05-27 DIAGNOSIS — J45909 Unspecified asthma, uncomplicated: Secondary | ICD-10-CM | POA: Insufficient documentation

## 2014-05-27 DIAGNOSIS — F3289 Other specified depressive episodes: Secondary | ICD-10-CM | POA: Diagnosis not present

## 2014-05-27 DIAGNOSIS — Z880 Allergy status to penicillin: Secondary | ICD-10-CM | POA: Insufficient documentation

## 2014-05-27 DIAGNOSIS — Z862 Personal history of diseases of the blood and blood-forming organs and certain disorders involving the immune mechanism: Secondary | ICD-10-CM | POA: Diagnosis not present

## 2014-05-27 DIAGNOSIS — G43909 Migraine, unspecified, not intractable, without status migrainosus: Secondary | ICD-10-CM | POA: Insufficient documentation

## 2014-05-27 DIAGNOSIS — M129 Arthropathy, unspecified: Secondary | ICD-10-CM | POA: Diagnosis not present

## 2014-05-27 DIAGNOSIS — F17200 Nicotine dependence, unspecified, uncomplicated: Secondary | ICD-10-CM | POA: Insufficient documentation

## 2014-05-27 DIAGNOSIS — M5412 Radiculopathy, cervical region: Secondary | ICD-10-CM

## 2014-05-27 DIAGNOSIS — R5381 Other malaise: Secondary | ICD-10-CM | POA: Insufficient documentation

## 2014-05-27 DIAGNOSIS — R5383 Other fatigue: Secondary | ICD-10-CM | POA: Diagnosis not present

## 2014-05-27 DIAGNOSIS — K219 Gastro-esophageal reflux disease without esophagitis: Secondary | ICD-10-CM | POA: Diagnosis not present

## 2014-05-27 DIAGNOSIS — Z79899 Other long term (current) drug therapy: Secondary | ICD-10-CM | POA: Diagnosis not present

## 2014-05-27 DIAGNOSIS — F411 Generalized anxiety disorder: Secondary | ICD-10-CM | POA: Insufficient documentation

## 2014-05-27 DIAGNOSIS — F329 Major depressive disorder, single episode, unspecified: Secondary | ICD-10-CM | POA: Diagnosis not present

## 2014-05-27 DIAGNOSIS — M542 Cervicalgia: Secondary | ICD-10-CM | POA: Insufficient documentation

## 2014-05-27 DIAGNOSIS — R209 Unspecified disturbances of skin sensation: Secondary | ICD-10-CM | POA: Diagnosis not present

## 2014-05-27 NOTE — ED Notes (Signed)
Pt given warm blanket.

## 2014-05-27 NOTE — ED Notes (Signed)
The patient said she started having pain, tingling and numbness in her right arm and her side.  She said it started three days ago and it has gotten worse.  She says she has been having neck issues and is doing physical therapy.  Since she started the physical therapy her neck has gotten worse instead of better and now she is having the arm and side numbness, pain and tingling.

## 2014-05-28 ENCOUNTER — Encounter: Payer: Self-pay | Admitting: Family Medicine

## 2014-05-28 NOTE — ED Provider Notes (Signed)
CSN: [id number]     Arrival date & time 05/27/14  1949 History   First MD Initiated Contact with Patient 05/27/14 2332     Chief Complaint  Patient presents with  . Numbness    The patient said she started having pain, tingling and numbness in her right arm and her side.  She said it started three days ago and it has gotten worse.    . Neck Pain     (Consider location/radiation/quality/duration/timing/severity/associated sxs/prior Treatment) Patient is a 38 y.o. female presenting with neck pain. The history is provided by the patient. No language interpreter was used.  Neck Pain Pain location:  R side Pain radiates to:  R arm and R shoulder Associated symptoms: numbness and weakness   Associated symptoms: no fever   Associated symptoms comment:  She has been undergoing evaluation of neck pain for the past month. Today she felt symptoms included weakness and more numbness. Also reports difficulty holding pen to write today while at work, which is new.    Past Medical History  Diagnosis Date  . Migraine   . Alcoholism   . Arthritis   . Asthma   . Depression   . GERD (gastroesophageal reflux disease)   . IBS (irritable bowel syndrome)   . Allergy   . Anemia   . Anxiety    Past Surgical History  Procedure Laterality Date  . Cervical fusion    . Cholecystectomy    . Laparoscopic hysterectomy    . Tonsillectomy    . Inguinal hernai repair     Family History  Problem Relation Age of Onset  . Breast cancer Paternal Grandmother   . Heart disease Maternal Grandfather   . Colon cancer Neg Hx   . Esophageal cancer Neg Hx   . Rectal cancer Neg Hx   . Stomach cancer Neg Hx    History  Substance Use Topics  . Smoking status: Current Every Day Smoker -- 0.50 packs/day    Types: Cigarettes  . Smokeless tobacco: Never Used  . Alcohol Use: Yes     Comment: in recovery   OB History   Grav Para Term Preterm Abortions TAB SAB Ect Mult Living                 Review of Systems   Constitutional: Negative for fever and chills.  Musculoskeletal: Positive for neck pain.       See HPI.  Skin: Negative.   Neurological: Positive for weakness and numbness.      Allergies  Codeine and Penicillins  Home Medications   Prior to Admission medications   Medication Sig Start Date End Date Taking? Authorizing Provider  ALPRAZolam Duanne Moron) 1 MG tablet Take 1 mg by mouth daily as needed for anxiety.    Yes Historical Provider, MD  atorvastatin (LIPITOR) 20 MG tablet Take 20 mg by mouth daily.   Yes Historical Provider, MD  carbamazepine (EQUETRO) 200 MG CP12 Take 400 mg by mouth daily.   Yes Historical Provider, MD  diazepam (VALIUM) 5 MG tablet Take 1 tablet (5 mg total) by mouth every 8 (eight) hours as needed for muscle spasms. 04/21/14  Yes Evelina Bucy, MD  diphenoxylate-atropine (LOMOTIL) 2.5-0.025 MG per tablet Take 1 tablet by mouth 4 (four) times daily as needed for diarrhea or loose stools. 02/26/13  Yes Jerene Bears, MD  estradiol (ESTRACE) 1 MG tablet Take 1 mg by mouth at bedtime.   Yes Historical Provider, MD  folic acid (FOLVITE)  1 MG tablet Take 1 mg by mouth at bedtime.   Yes Historical Provider, MD  hydrocortisone-pramoxine [facility]) 2.5-1 % rectal cream Place 1 application rectally 3 (three) times daily. 02/19/14  Yes Midge Minium, MD  lamoTRIgine (LAMICTAL) 200 MG tablet Take 200 mg by mouth at bedtime.   Yes Historical Provider, MD  meloxicam (MOBIC) 15 MG tablet Take 1 tablet (15 mg total) by mouth daily. 03/20/14  Yes Midge Minium, MD  methotrexate (RHEUMATREX) 2.5 MG tablet Take 2.5 mg by mouth once a week. Caution:Chemotherapy. Protect from light. Take on Saturdays   Yes Historical Provider, MD  omeprazole (PRILOSEC) 40 MG capsule Take 1 capsule (40 mg total) by mouth daily. 02/26/13  Yes Jerene Bears, MD  sulindac (CLINORIL) 200 MG tablet Take 200 mg by mouth daily.  06/17/13  Yes Historical Provider, MD  SUMAtriptan (IMITREX) 50 MG tablet Take  1 tablet (50 mg total) by mouth every 2 (two) hours as needed for migraine. 11/08/13  Yes Midge Minium, MD   BP 130/83  Pulse 82  Temp(Src) 97.7 F (36.5 C) (Oral)  Resp 20  SpO2 97% Physical Exam  Constitutional: She is oriented to person, place, and time. She appears well-developed and well-nourished. No distress.  Neck: Normal range of motion.  Pulmonary/Chest: Effort normal.  Musculoskeletal: Normal range of motion. She exhibits no edema.  Mild midline tenderness of cervical spine, greater right lateral tenderness. No palpable spasm. No swelling or discoloration.   Neurological: She is alert and oriented to person, place, and time. She has normal reflexes.  Slightly decreased but functional grip strength of right hand when compared with the left and biceps/triceps strength. Deltoid weak on the right to arm lift. Preserved sensation to sharp and dull bilateral upper extremities.   Skin: Skin is warm and dry.  Psychiatric: She has a normal mood and affect.    ED Course  Procedures (including critical care time) Labs Review Labs Reviewed - No data to display  Imaging Review No results found.   EKG Interpretation None      MDM   Final diagnoses:  None    1. Cervical radiculopathy  Records reviewed including available notes and imaging studies (myelogram and CT angio neck). No central or cord deficits. Suspect nerve root involvement only. Soft collar provided for comfort. Patient encouraged to follow up with Dr. Joya Salm this week. Patient seen and evaluated by Dr. Kathrynn Humble.   Dewaine Oats, PA-C 05/28/14 [phone]

## 2014-05-28 NOTE — Discharge Instructions (Signed)

## 2014-05-31 NOTE — ED Provider Notes (Signed)
Medical screening examination/treatment/procedure(s) were conducted as a shared visit with non-physician practitioner(s) and myself.  I personally evaluated the patient during the encounter.   EKG Interpretation None       PT with numbness and weakness to the LUE. Hx of cervical spine pathology. Symptoms have progressed more recently. Pt is seeing Neurosurgery. On exam - pt has intact bicepital and brachial reflex, and sensory exam is normal as well. Proximal muscles, specifically deltoid muscle appears weak on exam as she has trouble with abduction. Suspect nerve root compression, and not a cord compression at this time. Pt advised to see Dr. Joya Salm soon.  Varney Biles, MD 05/31/14 [phone]

## 2014-06-12 ENCOUNTER — Encounter: Payer: Self-pay | Admitting: Family Medicine

## 2014-06-13 ENCOUNTER — Telehealth: Payer: Self-pay | Admitting: General Practice

## 2014-06-13 NOTE — Telephone Encounter (Signed)
Spoke with pt who advised she just wanted to hold on and will decide what she would like to do and call us back .

## 2014-06-13 NOTE — Telephone Encounter (Signed)
received a fax from [facility] in regards to pt. Per this result pt has R carpal tunnel. Mild R c5 radiculopathy. Next step would be an hand specialist and ortho/neuro surg for c5 radiculopathy.

## 2014-06-18 ENCOUNTER — Encounter: Payer: Self-pay | Admitting: General Practice

## 2014-06-30 ENCOUNTER — Encounter: Payer: Self-pay | Admitting: Family Medicine

## 2014-07-22 ENCOUNTER — Other Ambulatory Visit: Payer: Self-pay | Admitting: General Practice

## 2014-07-22 MED ORDER — MELOXICAM 15 MG PO TABS: 15.0000 mg | ORAL_TABLET | Freq: Every day | ORAL | Status: DC

## 2014-08-04 ENCOUNTER — Ambulatory Visit (INDEPENDENT_AMBULATORY_CARE_PROVIDER_SITE_OTHER): Payer: BC Managed Care – PPO | Admitting: Emergency Medicine

## 2014-08-04 VITALS — BP 122/82 | HR 101 | Temp 98.4°F | Resp 18 | Ht 66.5 in | Wt 246.0 lb

## 2014-08-04 DIAGNOSIS — J209 Acute bronchitis, unspecified: Secondary | ICD-10-CM

## 2014-08-04 DIAGNOSIS — J018 Other acute sinusitis: Secondary | ICD-10-CM

## 2014-08-04 MED ORDER — LEVOFLOXACIN 500 MG PO TABS: 500.0000 mg | ORAL_TABLET | Freq: Every day | ORAL | Status: AC

## 2014-08-04 MED ORDER — PSEUDOEPHEDRINE-GUAIFENESIN ER 60-600 MG PO TB12: 1.0000 | ORAL_TABLET | Freq: Two times a day (BID) | ORAL | Status: DC

## 2014-08-04 NOTE — Patient Instructions (Signed)

## 2014-08-04 NOTE — Progress Notes (Signed)
[facility] [address] [place] [address] [phone]  Date:  08/04/2014   Name:  [name]   DOB:  [birth year]   MRN:  [id number]  PCP:  Annye Asa, MD    Chief Complaint: Cough   History of Present Illness:  [name] is a 38 y.o. very pleasant female patient who presents with the following:  Ill since yesterday with sudden cough.  Largely not productive.  Some wheezing but no shortness of breath.   Nasal congestion. No discharge.  Post nasal drainage mostly purulent.   No sore throat No fever or chills.   History of reactive airway disease. No improvement with over the counter medications or other home remedies. Denies other complaint or health concern today.   Patient Active Problem List   Diagnosis Date Noted  . Other malaise and fatigue 03/12/2014  . LBP (low back pain) 03/12/2014  . Other and unspecified hyperlipidemia 11/08/2013  . Routine general medical examination at a health care facility 08/06/2013  . Obesity, Class II, BMI 35-39.9, with comorbidity 08/06/2013  . Sore on scalp 06/19/2013  . Tobacco use disorder 06/19/2013  . Right shoulder pain 04/17/2013  . IBS (irritable bowel syndrome) 03/29/2013  . Bipolar I disorder 02/26/2013  . GERD (gastroesophageal reflux disease) 02/26/2013  . Chronic diarrhea 02/26/2013  . Chronic migraine 02/26/2013  . Rheumatoid arthritis(714.0) 02/26/2013    Past Medical History  Diagnosis Date  . Migraine   . Alcoholism   . Arthritis   . Asthma   . Depression   . GERD (gastroesophageal reflux disease)   . IBS (irritable bowel syndrome)   . Allergy   . Anemia   . Anxiety     Past Surgical History  Procedure Laterality Date  . Cervical fusion    . Cholecystectomy    . Laparoscopic hysterectomy    . Tonsillectomy    . Inguinal hernai repair      History  Substance Use Topics  . Smoking status: Current Every Day Smoker -- 0.50 packs/day    Types: Cigarettes  .  Smokeless tobacco: Never Used  . Alcohol Use: Yes     Comment: in recovery    Family History  Problem Relation Age of Onset  . Breast cancer Paternal Grandmother   . Heart disease Maternal Grandfather   . Colon cancer Neg Hx   . Esophageal cancer Neg Hx   . Rectal cancer Neg Hx   . Stomach cancer Neg Hx     Allergies  Allergen Reactions  . Codeine Nausea Only    derivatives  . Penicillins Rash and Other (See Comments)    "skin turns black"    Medication list has been reviewed and updated.  Current Outpatient Prescriptions on File Prior to Visit  Medication Sig Dispense Refill  . ALPRAZolam (XANAX) 1 MG tablet Take 1 mg by mouth daily as needed for anxiety.       Marland Kitchen atorvastatin (LIPITOR) 20 MG tablet Take 20 mg by mouth daily.      . carbamazepine (EQUETRO) 200 MG CP12 Take 400 mg by mouth daily.      . diazepam (VALIUM) 5 MG tablet Take 1 tablet (5 mg total) by mouth every 8 (eight) hours as needed for muscle spasms.  30 tablet  0  . diphenoxylate-atropine (LOMOTIL) 2.5-0.025 MG per tablet Take 1 tablet by mouth 4 (four) times daily as needed for diarrhea or loose stools.  30 tablet  0  .  estradiol (ESTRACE) 1 MG tablet Take 1 mg by mouth at bedtime.      . hydrocortisone-pramoxine (ANALPRAM-HC) 2.5-1 % rectal cream Place 1 application rectally 3 (three) times daily.  30 g  1  . lamoTRIgine (LAMICTAL) 200 MG tablet Take 200 mg by mouth at bedtime.      . meloxicam (MOBIC) 15 MG tablet Take 1 tablet (15 mg total) by mouth daily.  30 tablet  3  . omeprazole (PRILOSEC) 40 MG capsule Take 1 capsule (40 mg total) by mouth daily.  30 capsule  0  . sulindac (CLINORIL) 200 MG tablet Take 200 mg by mouth daily.       . SUMAtriptan (IMITREX) 50 MG tablet Take 1 tablet (50 mg total) by mouth every 2 (two) hours as needed for migraine.  10 tablet  6  . folic acid (FOLVITE) 1 MG tablet Take 1 mg by mouth at bedtime.      . methotrexate (RHEUMATREX) 2.5 MG tablet Take 2.5 mg by mouth once a  week. Caution:Chemotherapy. Protect from light. Take on Saturdays       No current facility-administered medications on file prior to visit.    Review of Systems:  As per HPI, otherwise negative.    Physical Examination: Filed Vitals:   08/04/14 1403  BP: 122/82  Pulse: 101  Temp: 98.4 F (36.9 C)  Resp: 18   Filed Vitals:   08/04/14 1403  Height: 5' 6.5" (1.689 m)  Weight: 246 lb (111.585 kg)   Body mass index is 39.12 kg/(m^2). Ideal Body Weight: Weight in (lb) to have BMI = 25: 156.9  GEN: WDWN, NAD, Non-toxic, A & O x 3 HEENT: Atraumatic, Normocephalic. Neck supple. No masses, No LAD. Ears and Nose: No external deformity. CV: RRR, No M/G/R. No JVD. No thrill. No extra heart sounds. PULM: CTA B, no wheezes, crackles, rhonchi. No retractions. No resp. distress. No accessory muscle use. ABD: S, NT, ND, +BS. No rebound. No HSM. EXTR: No c/c/e NEURO Normal gait.  PSYCH: Normally interactive. Conversant. Not depressed or anxious appearing.  Calm demeanor.     Assessment and Plan: Sinusitis Bronchitis with bronchospasm Albuterol mucinex d Phen c cod levaquin  Signed,  Ellison Carwin, MD

## 2014-08-05 ENCOUNTER — Telehealth: Payer: Self-pay

## 2014-08-05 NOTE — Telephone Encounter (Signed)
Pt states she was seen yesterday and now have some added symptoms she would like to discuss with the Dr.'s asst. Please call [phone]

## 2014-08-05 NOTE — Telephone Encounter (Signed)
Pt has the chills and body aches now. She states that she was asked to call by her supervisor. They are worried it is the flu. Advised her viral vs bacterial infection symptoms. Pt is going to rest, increase fluids and treat symptoms today. If she gets worse she will RTC.

## 2014-08-07 ENCOUNTER — Ambulatory Visit: Payer: Self-pay | Admitting: Family Medicine

## 2014-08-10 ENCOUNTER — Encounter: Payer: Self-pay | Admitting: Family Medicine

## 2014-08-10 DIAGNOSIS — M5489 Other dorsalgia: Secondary | ICD-10-CM

## 2014-08-10 DIAGNOSIS — M542 Cervicalgia: Secondary | ICD-10-CM

## 2014-08-11 ENCOUNTER — Encounter: Payer: Self-pay | Admitting: Family Medicine

## 2014-08-11 NOTE — Telephone Encounter (Signed)
Referral placed.

## 2014-08-22 ENCOUNTER — Other Ambulatory Visit: Payer: Self-pay | Admitting: Family Medicine

## 2014-08-22 NOTE — Telephone Encounter (Signed)
Med filled.  

## 2014-09-04 ENCOUNTER — Encounter: Payer: Self-pay | Admitting: Family Medicine

## 2014-09-08 ENCOUNTER — Encounter: Payer: BC Managed Care – PPO | Admitting: Family Medicine

## 2014-09-08 ENCOUNTER — Ambulatory Visit (INDEPENDENT_AMBULATORY_CARE_PROVIDER_SITE_OTHER): Payer: BC Managed Care – PPO | Admitting: Family Medicine

## 2014-09-08 ENCOUNTER — Encounter: Payer: Self-pay | Admitting: Family Medicine

## 2014-09-08 VITALS — BP 124/86 | HR 92 | Temp 98.2°F | Resp 16 | Ht 67.0 in | Wt 247.1 lb

## 2014-09-08 DIAGNOSIS — Z0000 Encounter for general adult medical examination without abnormal findings: Secondary | ICD-10-CM

## 2014-09-08 NOTE — Progress Notes (Signed)
   Subjective:    Patient ID: [name], female    DOB: 04-Jun-1976, 38 y.o.   MRN: [id number]  HPI CPE- UTD on GYN w/ Dr Vinetta Bergamo.  Review of Systems Patient reports no vision/ hearing changes, adenopathy,fever, weight change,  persistant/recurrent hoarseness , swallowing issues, chest pain, edema, persistant/recurrent cough, hemoptysis, dyspnea (rest/exertional/paroxysmal nocturnal), gastrointestinal bleeding (melena, rectal bleeding), abdominal pain, significant heartburn, bowel changes, GU symptoms (dysuria, hematuria, incontinence), Gyn symptoms (abnormal  bleeding, pain),  syncope, focal weakness, memory loss, numbness & tingling, skin/hair/nail changes, abnormal bruising or bleeding, anxiety, or depression.   + intermittent palpitations/fluttering    Objective:   Physical Exam General Appearance:    Alert, cooperative, no distress, appears stated age, obese  Head:    Normocephalic, without obvious abnormality, atraumatic  Eyes:    PERRL, conjunctiva/corneas clear, EOM's intact, fundi    benign, both eyes  Ears:    Normal TM's and external ear canals, both ears  Nose:   Nares normal, septum midline, mucosa normal, no drainage    or sinus tenderness  Throat:   Lips, mucosa, and tongue normal; teeth and gums normal  Neck:   Supple, symmetrical, trachea midline, no adenopathy;    Thyroid: no enlargement/tenderness/nodules  Back:     Symmetric, no curvature, ROM normal, no CVA tenderness  Lungs:     Clear to auscultation bilaterally, respirations unlabored  Chest Wall:    No tenderness or deformity   Heart:    Regular rate and rhythm, S1 and S2 normal, no murmur, rub   or gallop  Breast Exam:    Deferred to GYN  Abdomen:     Soft, non-tender, bowel sounds active all four quadrants,    no masses, no organomegaly  Genitalia:    Deferred to GYN  Rectal:    Extremities:   Extremities normal, atraumatic, no cyanosis or edema  Pulses:   2+ and symmetric all extremities  Skin:   Skin  color, texture, turgor normal, no rashes or lesions  Lymph nodes:   Cervical, supraclavicular, and axillary nodes normal  Neurologic:   CNII-XII intact, normal strength, sensation and reflexes    throughout          Assessment & Plan:

## 2014-09-08 NOTE — Assessment & Plan Note (Signed)
Pt's PE WNL w/ exception of obesity.  UTD on GYN.  Check labs.  Anticipatory guidance provided.  

## 2014-09-08 NOTE — Patient Instructions (Signed)
Follow up in 6 months to recheck cholesterol- sooner if needed Keep me updated on what Neuro says If no relief after Epidural injxn, let me know if you would like an opinion at [facility] notify you of your lab results and make any changes if needed Call with any questions or concerns Hang in there! Happy Holidays!

## 2014-09-08 NOTE — Progress Notes (Signed)
Pre visit review using our clinic review tool, if applicable. No additional management support is needed unless otherwise documented below in the visit note. 

## 2014-09-09 LAB — CBC WITH DIFFERENTIAL/PLATELET
BASOS PCT: 1 % (ref 0.0–3.0)
Basophils Absolute: 0.1 10*3/uL (ref 0.0–0.1)
EOS PCT: 1.1 % (ref 0.0–5.0)
Eosinophils Absolute: 0.1 10*3/uL (ref 0.0–0.7)
HEMATOCRIT: 38.3 % (ref 36.0–46.0)
Hemoglobin: 12.8 g/dL (ref 12.0–15.0)
Lymphocytes Relative: 22.7 % (ref 12.0–46.0)
Lymphs Abs: 2.2 10*3/uL (ref 0.7–4.0)
MCHC: 33.3 g/dL (ref 30.0–36.0)
MCV: 95.6 fl (ref 78.0–100.0)
MONO ABS: 0.4 10*3/uL (ref 0.1–1.0)
Monocytes Relative: 4.2 % (ref 3.0–12.0)
NEUTROS PCT: 71 % (ref 43.0–77.0)
Neutro Abs: 6.9 10*3/uL (ref 1.4–7.7)
Platelets: 285 10*3/uL (ref 150.0–400.0)
RBC: 4.01 Mil/uL (ref 3.87–5.11)
RDW: 12.5 % (ref 11.5–15.5)
WBC: 9.8 10*3/uL (ref 4.0–10.5)

## 2014-09-09 LAB — BASIC METABOLIC PANEL
BUN: 13 mg/dL (ref 6–23)
CALCIUM: 8.7 mg/dL (ref 8.4–10.5)
CO2: 24 mEq/L (ref 19–32)
CREATININE: 0.8 mg/dL (ref 0.4–1.2)
Chloride: 107 mEq/L (ref 96–112)
GFR: 88.46 mL/min (ref >60.00)
Glucose, Bld: 90 mg/dL (ref 70–99)
Potassium: 4 mEq/L (ref 3.5–5.1)
SODIUM: 140 meq/L (ref 135–145)

## 2014-09-09 LAB — LIPID PANEL
CHOL/HDL RATIO: 4
Cholesterol: 203 mg/dL — ABNORMAL HIGH (ref 0–200)
HDL: 52 mg/dL (ref >39.00)
LDL CALC: 112 mg/dL — AB (ref 0–99)
NONHDL: 151
Triglycerides: 195 mg/dL — ABNORMAL HIGH (ref 0.0–149.0)
VLDL: 39 mg/dL (ref 0.0–40.0)

## 2014-09-09 LAB — HEPATIC FUNCTION PANEL
ALT: 10 U/L (ref 0–35)
AST: 14 U/L (ref 0–37)
Albumin: 4 g/dL (ref 3.5–5.2)
Alkaline Phosphatase: 77 U/L (ref 39–117)
BILIRUBIN DIRECT: 0 mg/dL (ref 0.0–0.3)
BILIRUBIN TOTAL: 0.3 mg/dL (ref 0.2–1.2)
Total Protein: 6.8 g/dL (ref 6.0–8.3)

## 2014-09-09 LAB — TSH: TSH: 1.47 u[IU]/mL (ref 0.35–4.50)

## 2014-09-09 LAB — VITAMIN D 25 HYDROXY (VIT D DEFICIENCY, FRACTURES): VITD: 20.31 ng/mL — ABNORMAL LOW (ref 30.00–100.00)

## 2014-09-10 ENCOUNTER — Other Ambulatory Visit: Payer: Self-pay | Admitting: General Practice

## 2014-09-10 MED ORDER — VITAMIN D (ERGOCALCIFEROL) 1.25 MG (50000 UNIT) PO CAPS
50000.0000 [IU] | ORAL_CAPSULE | ORAL | Status: DC
Start: 2014-09-10 — End: 2015-03-11

## 2014-09-19 ENCOUNTER — Encounter: Payer: Self-pay | Admitting: Nurse Practitioner

## 2014-09-19 ENCOUNTER — Emergency Department (HOSPITAL_COMMUNITY)
Admission: EM | Admit: 2014-09-19 | Discharge: 2014-09-19 | Disposition: A | Discharge status: Home / Self Care | Payer: BC Managed Care – PPO | Attending: Emergency Medicine | Admitting: Emergency Medicine

## 2014-09-19 ENCOUNTER — Emergency Department (HOSPITAL_COMMUNITY): Payer: BC Managed Care – PPO

## 2014-09-19 ENCOUNTER — Encounter (HOSPITAL_COMMUNITY): Payer: Self-pay | Admitting: *Deleted

## 2014-09-19 ENCOUNTER — Ambulatory Visit (INDEPENDENT_AMBULATORY_CARE_PROVIDER_SITE_OTHER): Payer: BC Managed Care – PPO | Admitting: Nurse Practitioner

## 2014-09-19 ENCOUNTER — Encounter: Payer: Self-pay | Admitting: Family Medicine

## 2014-09-19 VITALS — BP 117/85 | HR 94 | Temp 98.0°F | Resp 18 | Ht 65.75 in | Wt 243.0 lb

## 2014-09-19 DIAGNOSIS — Z7952 Long term (current) use of systemic steroids: Secondary | ICD-10-CM | POA: Diagnosis not present

## 2014-09-19 DIAGNOSIS — Z720 Tobacco use: Secondary | ICD-10-CM | POA: Insufficient documentation

## 2014-09-19 DIAGNOSIS — F419 Anxiety disorder, unspecified: Secondary | ICD-10-CM | POA: Diagnosis not present

## 2014-09-19 DIAGNOSIS — Z79899 Other long term (current) drug therapy: Secondary | ICD-10-CM | POA: Insufficient documentation

## 2014-09-19 DIAGNOSIS — D649 Anemia, unspecified: Secondary | ICD-10-CM | POA: Insufficient documentation

## 2014-09-19 DIAGNOSIS — M1990 Unspecified osteoarthritis, unspecified site: Secondary | ICD-10-CM | POA: Insufficient documentation

## 2014-09-19 DIAGNOSIS — Z791 Long term (current) use of non-steroidal anti-inflammatories (NSAID): Secondary | ICD-10-CM | POA: Insufficient documentation

## 2014-09-19 DIAGNOSIS — R2990 Unspecified symptoms and signs involving the nervous system: Secondary | ICD-10-CM | POA: Insufficient documentation

## 2014-09-19 DIAGNOSIS — Z880 Allergy status to penicillin: Secondary | ICD-10-CM | POA: Diagnosis not present

## 2014-09-19 DIAGNOSIS — R51 Headache: Secondary | ICD-10-CM | POA: Diagnosis present

## 2014-09-19 DIAGNOSIS — R270 Ataxia, unspecified: Secondary | ICD-10-CM

## 2014-09-19 DIAGNOSIS — J45909 Unspecified asthma, uncomplicated: Secondary | ICD-10-CM | POA: Insufficient documentation

## 2014-09-19 DIAGNOSIS — R519 Headache, unspecified: Secondary | ICD-10-CM

## 2014-09-19 DIAGNOSIS — F329 Major depressive disorder, single episode, unspecified: Secondary | ICD-10-CM | POA: Diagnosis not present

## 2014-09-19 DIAGNOSIS — G43109 Migraine with aura, not intractable, without status migrainosus: Secondary | ICD-10-CM | POA: Diagnosis not present

## 2014-09-19 DIAGNOSIS — K219 Gastro-esophageal reflux disease without esophagitis: Secondary | ICD-10-CM | POA: Insufficient documentation

## 2014-09-19 LAB — CBC
HCT: 39.8 % (ref 36.0–46.0)
Hemoglobin: 13.7 g/dL (ref 12.0–15.0)
MCH: 32 pg (ref 26.0–34.0)
MCHC: 34.4 g/dL (ref 30.0–36.0)
MCV: 93 fL (ref 78.0–100.0)
PLATELETS: 303 10*3/uL (ref 150–400)
RBC: 4.28 MIL/uL (ref 3.87–5.11)
RDW: 12.3 % (ref 11.5–15.5)
WBC: 13.2 10*3/uL — AB (ref 4.0–10.5)

## 2014-09-19 LAB — BASIC METABOLIC PANEL
ANION GAP: 14 (ref 5–15)
BUN: 13 mg/dL (ref 6–23)
CHLORIDE: 98 meq/L (ref 96–112)
CO2: 27 meq/L (ref 19–32)
Calcium: 9.5 mg/dL (ref 8.4–10.5)
Creatinine, Ser: 0.54 mg/dL (ref 0.50–1.10)
GFR calc Af Amer: >90 mL/min (ref >90)
GFR calc non Af Amer: >90 mL/min (ref >90)
Glucose, Bld: 96 mg/dL (ref 70–99)
Potassium: 4.1 mEq/L (ref 3.7–5.3)
SODIUM: 139 meq/L (ref 137–147)

## 2014-09-19 MED ORDER — KETOROLAC TROMETHAMINE 30 MG/ML IJ SOLN
30.0000 mg | Freq: Once | INTRAMUSCULAR | Status: AC
  Administered 2014-09-19: 30 mg via INTRAVENOUS
  Filled 2014-09-19: qty 1

## 2014-09-19 MED ORDER — LORAZEPAM 2 MG/ML IJ SOLN
1.0000 mg | Freq: Once | INTRAMUSCULAR | Status: AC
Start: 2014-09-19 — End: 2014-09-19
  Administered 2014-09-19: 1 mg via INTRAVENOUS
  Filled 2014-09-19: qty 1

## 2014-09-19 MED ORDER — GADOBENATE DIMEGLUMINE 529 MG/ML IV SOLN
20.0000 mL | Freq: Once | INTRAVENOUS | Status: AC | PRN
  Administered 2014-09-19: 20 mL via INTRAVENOUS

## 2014-09-19 MED ORDER — SODIUM CHLORIDE 0.9 % IV BOLUS (SEPSIS)
1000.0000 mL | Freq: Once | INTRAVENOUS | Status: AC
Start: 2014-09-19 — End: 2014-09-19
  Administered 2014-09-19: 1000 mL via INTRAVENOUS

## 2014-09-19 MED ORDER — METOCLOPRAMIDE HCL 5 MG/ML IJ SOLN
10.0000 mg | Freq: Once | INTRAMUSCULAR | Status: AC
  Administered 2014-09-19: 10 mg via INTRAVENOUS
  Filled 2014-09-19: qty 2

## 2014-09-19 NOTE — Progress Notes (Signed)
Pre visit review using our clinic review tool, if applicable. No additional management support is needed unless otherwise documented below in the visit note. 

## 2014-09-19 NOTE — ED Provider Notes (Signed)
CSN: [id number]     Arrival date & time 09/19/14  1701 History   First MD Initiated Contact with Patient 09/19/14 1817     Chief Complaint  Patient presents with  . Headache     (Consider location/radiation/quality/duration/timing/severity/associated sxs/prior Treatment) HPI Comments: 38 year old female with a past medical history of migraines, alcoholism, asthma, depression, GERD, IBS, anemia and anxiety presenting to the emergency department from her primary care physician's office for evaluation of headache ongoing for 1 week. It is noted that at her primary care physician's office, she had an abnormal neurologic exam, was unable to track her eye movements and was having difficulty with speech. Patient reports one week ago she had an epidural injection in her back for pain relief by Dr. [name]. The day after the epidural, she began experiencing a generalized headache, described as pressure throughout her head. Headache has been worsening, and states she feels "fuzzy". She reports difficulty getting her words out. Admits to associated nausea without vomiting. Admits to photophobia. Her back pain has significantly improved. Denies vision changes, fever, chills or neck pain. She tried taking over-the-counter Imitrex and drinking caffeine with no relief.  Patient is a 38 y.o. female presenting with headaches. The history is provided by the patient and a parent.  Headache   Past Medical History  Diagnosis Date  . Migraine   . Alcoholism   . Arthritis   . Asthma   . Depression   . GERD (gastroesophageal reflux disease)   . IBS (irritable bowel syndrome)   . Allergy   . Anemia   . Anxiety    Past Surgical History  Procedure Laterality Date  . Cervical fusion    . Cholecystectomy    . Laparoscopic hysterectomy    . Tonsillectomy    . Inguinal hernai repair     Family History  Problem Relation Age of Onset  . Breast cancer Paternal Grandmother   . Heart disease Maternal Grandfather    . Colon cancer Neg Hx   . Esophageal cancer Neg Hx   . Rectal cancer Neg Hx   . Stomach cancer Neg Hx    History  Substance Use Topics  . Smoking status: Current Every Day Smoker -- 0.50 packs/day    Types: Cigarettes  . Smokeless tobacco: Never Used  . Alcohol Use: Yes     Comment: in recovery   OB History    No data available     Review of Systems  Neurological: Positive for headaches.   10 Systems reviewed and are negative for acute change except as noted in the HPI.  Allergies  Codeine and Penicillins  Home Medications   Prior to Admission medications   Medication Sig Start Date End Date Taking? Authorizing Provider  ALPRAZolam Duanne Moron) 1 MG tablet Take 1 mg by mouth daily as needed for anxiety.     Historical Provider, MD  atorvastatin (LIPITOR) 20 MG tablet Take 20 mg by mouth daily.    Historical Provider, MD  carbamazepine (EQUETRO) 200 MG CP12 Take 400 mg by mouth daily.    Historical Provider, MD  diazepam (VALIUM) 5 MG tablet Take 1 tablet (5 mg total) by mouth every 8 (eight) hours as needed for muscle spasms. 04/21/14   Evelina Bucy, MD  diphenoxylate-atropine (LOMOTIL) 2.5-0.025 MG per tablet Take 1 tablet by mouth 4 (four) times daily as needed for diarrhea or loose stools. 02/26/13   Jerene Bears, MD  estradiol (ESTRACE) 1 MG tablet Take 1 mg by mouth  at bedtime.    Historical Provider, MD  folic acid (FOLVITE) 1 MG tablet Take 1 mg by mouth at bedtime.    Historical Provider, MD  gabapentin (NEURONTIN) 100 MG capsule Take 100 mg by mouth 2 (two) times daily. 08/22/14   Historical Provider, MD  hydrocortisone-pramoxine [facility]) 2.5-1 % rectal cream Place 1 application rectally 3 (three) times daily. 02/19/14   Midge Minium, MD  lamoTRIgine (LAMICTAL) 200 MG tablet Take 200 mg by mouth at bedtime.    Historical Provider, MD  meloxicam (MOBIC) 15 MG tablet Take 1 tablet (15 mg total) by mouth daily. 07/22/14   Midge Minium, MD  omeprazole  (PRILOSEC) 20 MG capsule TAKE 1 CAPSULE (20 MG TOTAL) BY MOUTH 2 (TWO) TIMES DAILY. 08/22/14   Midge Minium, MD  pseudoephedrine-guaifenesin [facility] D) 60-600 MG per tablet Take 1 tablet by mouth every 12 (twelve) hours. 08/04/14 08/04/15  Roselee Culver, MD  sulindac (CLINORIL) 200 MG tablet Take 200 mg by mouth daily.  06/17/13   Historical Provider, MD  SUMAtriptan (IMITREX) 50 MG tablet Take 1 tablet (50 mg total) by mouth every 2 (two) hours as needed for migraine. 11/08/13   Midge Minium, MD  Vitamin D, Ergocalciferol, (DRISDOL) 50000 UNITS CAPS capsule Take 1 capsule (50,000 Units total) by mouth every 7 (seven) days. 09/10/14   Midge Minium, MD   BP 138/90 mmHg  Pulse 93  Temp(Src) 98 F (36.7 C) (Axillary)  Resp 20  SpO2 99% Physical Exam  Constitutional: She is oriented to person, place, and time. She appears well-developed and well-nourished. No distress.  HENT:  Head: Normocephalic and atraumatic.  Mouth/Throat: Oropharynx is clear and moist.  Eyes: Conjunctivae are normal. Pupils are equal, round, and reactive to light.  EOMi, however squints eyes while assessing making this difficult.  Neck: Normal range of motion. Neck supple.  No meningeal signs.  Cardiovascular: Normal rate, regular rhythm, normal heart sounds and intact distal pulses.   Pulmonary/Chest: Effort normal and breath sounds normal. No respiratory distress.  Abdominal: Soft. Bowel sounds are normal. There is no tenderness.  Musculoskeletal: Normal range of motion. She exhibits no edema.  Neurological: She is alert and oriented to person, place, and time. She has normal strength. No sensory deficit. Coordination normal. GCS eye subscore is 4. GCS verbal subscore is 5. GCS motor subscore is 6.  Slow to get words out, no slurring. Equal grip strength bilateral. Shaking bilateral hands. Slightly ataxic gait.  Skin: Skin is warm and dry. No rash noted. She is not diaphoretic.  Psychiatric: Her  behavior is normal. Her mood appears anxious.  Nursing note and vitals reviewed.   ED Course  Procedures (including critical care time) Labs Review Labs Reviewed  CBC - Abnormal; Notable for the following:    WBC 13.2 (*)    All other components within normal limits  BASIC METABOLIC PANEL    Imaging Review Ct Head Wo Contrast  09/19/2014   CLINICAL DATA:  38 year old female with head pain/ pressure, nausea and shaking/ tremors as well as difficulty answering questions for the past week. History of epidural back injection 1 week previously.  EXAM: CT HEAD WITHOUT CONTRAST  TECHNIQUE: Contiguous axial images were obtained from the base of the skull through the vertex without intravenous contrast.  COMPARISON:  Prior CT scan of the head 04/21/2014  FINDINGS: Negative for acute intracranial hemorrhage, acute infarction, mass, mass effect, hydrocephalus or midline shift. Gray-white differentiation is preserved throughout. No  acute soft tissue or calvarial abnormality. The globes and orbits are symmetric and unremarkable. Normal aeration of the mastoid air cells and visualized paranasal sinuses. Septic cavi pellucidi noted incidentally.  IMPRESSION: Negative head CT.   Electronically Signed   By: Jacqulynn Cadet M.D.   On: 09/19/2014 19:11   Mr Jeri Cos VP Contrast  09/19/2014   CLINICAL DATA:  Headache, head and neck pressure, unsteady gait, lightheadedness for 1 week ; recent caffeine treatment for possible cerebral spinal fluid leak. Symptoms began after epidural injection 1 week ago. History of rheumatoid arthritis.  EXAM: MRI HEAD WITHOUT AND WITH CONTRAST  TECHNIQUE: Multiplanar, multiecho pulse sequences of the brain and surrounding structures were obtained without and with intravenous contrast.  CONTRAST:  63mL MULTIHANCE GADOBENATE DIMEGLUMINE 529 MG/ML IV SOLN  COMPARISON:  CT of the head September 19, 2014 at 1904 hr  FINDINGS: The ventricles and sulci are normal for patient's age. Cavum  septum pellucidum, a normal variant. No abnormal parenchymal signal, mass lesions, mass effect. No abnormal parenchymal enhancement. No reduced diffusion to suggest acute ischemia. No susceptibility artifact to suggest hemorrhage. Cerebellar tonsils descend 2 mm below the foramen magnum though, are not pointed in appearance and, there is ample cerebral spinal fluid space at the foramen magnum. 7 mm pineal cyst.  No abnormal extra-axial fluid collections. No extra-axial masses nor leptomeningeal/dural enhancement. Normal major intracranial vascular flow voids seen at the skull base.  Ocular globes and orbital contents are unremarkable though not tailored for evaluation. No abnormal sellar expansion. Visualized paranasal sinuses and mastoid air cells are well-aerated. No suspicious calvarial bone marrow signal. No abnormal sellar expansion. Craniocervical junction maintained.  IMPRESSION: No acute intracranial process ; no MR findings of intracranial hypotension or specific findings to explain head pressure.   Electronically Signed   By: Elon Alas   On: 09/19/2014 21:48     EKG Interpretation None      MDM   Final diagnoses:  Headache  Complicated migraine   Pt in NAD. AFVSS. EOMi, squinting closed intermittently throughout making exam difficult. Speech not slurred, difficulty getting words out. Slightly ataxic gait. Hx of migraines. Plan to give migraine cocktail and ativan as pt seems very anxious. Pt has never had HA like this in the past, CT head pending. Plan to consult neuro. 8:34 PM Head CT without acute findings. Labs normal. Pt reports some improvement with migraine cocktail. I spoke with neurology, Dr. Leonel Ramsay who advises MRI w and w/out contrast.  10:06 PM MRI normal. Pt reports continued improvement with migraine cocktail. She is no longer having speech difficulty. Ambulates without difficulty, no ataxia. I spoke with Dr. Leonel Ramsay to discuss this, who states this is more  than likely a complicated migraine. Pt also has a history of similar presentations with neuro symptoms with normal workups. Pt is stable for d/c, f/u with PCP and neurology. Return precautions given. Patient states understanding of treatment care plan and is agreeable.  10:33 PM Addendum: pt pulled her own IV out prior to discharge.  Discussed with attending Dr. Mingo Amber who agrees with plan of care.   Carman Ching, PA-C 09/19/14 Jarrell, PA-C 09/19/14 Miles, PA-C 09/19/14 Morrison, PA-C 09/19/14 [id number]  Evelina Bucy, MD 09/20/14 [phone]

## 2014-09-19 NOTE — ED Notes (Signed)
Pt reports having epidural back injection one week ago and now has head pressure, nausea, having difficulty answering questions this past week and shaking/tremors.

## 2014-09-19 NOTE — Patient Instructions (Signed)
Please go to ER for further work up of abnormal neuro exam.

## 2014-09-19 NOTE — ED Notes (Signed)
Patient ambulated without difficulty down hallway

## 2014-09-19 NOTE — Progress Notes (Signed)
Subjective:    [name] is a 38 y.o. female who presents for evaluation of headache described as pressure in head & neck.  Associated symptoms are persistent nausea without vomiting, "feeling foggy"-trouble with thought processes, unsteady gait, hot flushes w/diaphoresis, lightheaded, not sleeping well due to "pressure in head & neck". Symptoms started 1 week ago when she had epidural injection for back pain at [facility]. Pt states she called [place] [facility] several days ago-she was told to drink caffeine. This was not helpful. She called again this afternoon, but has not received reply. She has never had epi inj in past for back pain. She tried to work this week, but has had trouble accomplishing normal duties. She is in [employer]. 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 She is accompanied by her father who endorses that she does not seem herself-having trouble finding words, taking few moments to "get thoughts together" during conversation.  Pt appears to have head tremor & is in constant fidgety motion. She & father state this is normal for her. She denies vision change, fever, cough, or recent illness. She has Hx of RA for which she has been on methotrexate since 2009, treated by Dr Ouida Sills. Stopped MTX about 6 mos ago as her diagnosis seemed uncertain at her last appt. She is currently treated for bipolar disorder, has had hospitalizations in past. Pt states when hospitalized, she did not have similar symptoms.  The following portions of the patient's history were reviewed and updated as appropriate: allergies, current medications, past medical history, past social history, past surgical history and problem list.  Review of Systems Constitutional: negative for anorexia, chills, fatigue, fevers and night sweats Eyes: negative for visual disturbance Ears, nose, mouth, throat, and face: negative for earaches, nasal congestion and sore  throat Respiratory: negative for cough Cardiovascular: negative for chest pressure/discomfort, irregular heart beat and lower extremity edema Gastrointestinal: positive for nausea, negative for change in bowel habits, constipation, diarrhea and vomiting Musculoskeletal:positive for back pain and neck pain Neurological: positive for gait problems, headaches, paresthesia and speech problems, negative for weakness Behavioral/Psych: long Hx of bipolar disorder w/hospitalizations. Pt states symptoms have been controlled since starting equetro.    Objective:    BP 117/85 mmHg  Pulse 94  Temp(Src) 98 F (36.7 C) (Oral)  Resp 18  Ht 5' 5.75" (1.67 m)  Wt 243 lb (110.224 kg)  BMI 39.52 kg/m2  SpO2 97% General appearance: alert, cooperative, appears stated age, moderate distress, moderately obese and slowed mentation Head: Normocephalic, without obvious abnormality, atraumatic Eyes: negative findings: lids and lashes normal, conjunctivae and sclerae normal, corneas clear and pupils equal, round, reactive to light and accomodation Ears: bilat clear effusions, scars on TM bilat, bones visible bilat Throat: lips, mucosa, and tongue normal; teeth and gums normal Neck: no adenopathy, no carotid bruit, supple, symmetrical, trachea midline and thyroid not enlarged, symmetric, no tenderness/mass/nodules Lungs: clear to auscultation bilaterally Heart: regular rate and rhythm, S1, S2 normal, no murmur, click, rub or gallop Lymph nodes: no Merigold or cervical LAD Neurologic: Mental status: Alert, oriented, thought content appropriate Cranial nerves: III,IV,VI: extraocular muscles unable to track  Motor: grossly normal Coordination: finger to nose abnormal bilaterally Gait: occasionally grabs wall to steady self    Assessment: Plan   1. Abnormal neurological exam Mentation slowed, unable to track in center vision, clumsy w/finger to nose Head & neck discomfort Recent epidural injection XA:JOINOMVEH ICP,  CNS infection,  SE psych meds  Attempted to call [facility]-no answer at 4:10 pm  GO to ER for further eval.

## 2014-09-19 NOTE — ED Notes (Signed)
Pt to MRI at this time.

## 2014-09-19 NOTE — ED Notes (Signed)
Pt c/o head pressure from lumbar puncture last week.  Pt alert and oriented x's 3, skin warm and dry, color appropriate.  Pt's dad at bedside

## 2014-09-19 NOTE — Discharge Instructions (Signed)

## 2014-09-19 NOTE — ED Notes (Signed)
Pt returned from MRI °

## 2014-09-23 ENCOUNTER — Encounter: Payer: Self-pay | Admitting: Family Medicine

## 2014-09-23 DIAGNOSIS — R2990 Unspecified symptoms and signs involving the nervous system: Secondary | ICD-10-CM

## 2014-09-23 NOTE — Telephone Encounter (Signed)
Med filled.  

## 2014-09-30 ENCOUNTER — Ambulatory Visit: Payer: Self-pay | Admitting: Family Medicine

## 2014-10-20 ENCOUNTER — Ambulatory Visit (INDEPENDENT_AMBULATORY_CARE_PROVIDER_SITE_OTHER): Payer: BLUE CROSS/BLUE SHIELD | Admitting: Neurology

## 2014-10-20 ENCOUNTER — Encounter: Payer: Self-pay | Admitting: Neurology

## 2014-10-20 VITALS — BP 120/70 | HR 79 | Ht 66.0 in | Wt 252.1 lb

## 2014-10-20 DIAGNOSIS — R202 Paresthesia of skin: Secondary | ICD-10-CM

## 2014-10-20 DIAGNOSIS — G43019 Migraine without aura, intractable, without status migrainosus: Secondary | ICD-10-CM

## 2014-10-20 LAB — VITAMIN B12: Vitamin B-12: 638 pg/mL (ref 211–911)

## 2014-10-20 MED ORDER — PREGABALIN 75 MG PO CAPS: 75.0000 mg | ORAL_CAPSULE | Freq: Two times a day (BID) | ORAL | Status: DC

## 2014-10-20 MED ORDER — NAPROXEN 500 MG PO TABS: ORAL_TABLET | ORAL | Status: DC
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
## 2014-10-20 NOTE — Patient Instructions (Addendum)
1.  Check vitamin B12 2.  For severe migraine, imitrex 100mg  at headache onset.  OK to repeat in 2hr, if no improvement. 3.  For moderate migraine, take aleve 500mg  with benadryl 25mg  daily 4.  For preventative therapy, increase Lyrica 75mg  twice daily 5.  Trial of magnesium oxide 400-600mg  daily 6.  Return to clinic in 3 months.

## 2014-11-14 ENCOUNTER — Other Ambulatory Visit: Payer: Self-pay | Admitting: Family Medicine

## 2014-11-14 NOTE — Telephone Encounter (Signed)
Med filled.  

## 2014-12-05 ENCOUNTER — Encounter: Payer: Self-pay | Admitting: Family Medicine

## 2015-01-04 ENCOUNTER — Other Ambulatory Visit: Payer: Self-pay | Admitting: Family Medicine

## 2015-01-19 ENCOUNTER — Encounter: Payer: Self-pay | Admitting: Neurology

## 2015-01-19 ENCOUNTER — Ambulatory Visit (INDEPENDENT_AMBULATORY_CARE_PROVIDER_SITE_OTHER): Payer: BLUE CROSS/BLUE SHIELD | Admitting: Neurology

## 2015-01-19 VITALS — BP 110/80 | HR 73 | Ht 66.0 in | Wt 256.4 lb

## 2015-01-19 DIAGNOSIS — T50905A Adverse effect of unspecified drugs, medicaments and biological substances, initial encounter: Secondary | ICD-10-CM

## 2015-01-19 DIAGNOSIS — H539 Unspecified visual disturbance: Secondary | ICD-10-CM | POA: Diagnosis not present

## 2015-01-19 DIAGNOSIS — G43019 Migraine without aura, intractable, without status migrainosus: Secondary | ICD-10-CM | POA: Diagnosis not present

## 2015-01-19 DIAGNOSIS — G5601 Carpal tunnel syndrome, right upper limb: Secondary | ICD-10-CM

## 2015-01-19 DIAGNOSIS — T887XXA Unspecified adverse effect of drug or medicament, initial encounter: Secondary | ICD-10-CM

## 2015-01-19 DIAGNOSIS — M542 Cervicalgia: Secondary | ICD-10-CM

## 2015-01-19 MED ORDER — PREGABALIN 50 MG PO CAPS: 50.0000 mg | ORAL_CAPSULE | Freq: Every evening | ORAL | Status: DC | PRN

## 2015-01-19 MED ORDER — PREGABALIN 75 MG PO CAPS: 75.0000 mg | ORAL_CAPSULE | Freq: Every day | ORAL | Status: DC
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
## 2015-01-19 NOTE — Patient Instructions (Addendum)
Reduce Lyrica to 75mg  in the morning and 50mg  at bedtime You can try magnesium oxide 400-600mg  daily if headaches worsen Return to clinic 6 months

## 2015-03-08 ENCOUNTER — Other Ambulatory Visit: Payer: Self-pay | Admitting: Family Medicine

## 2015-03-09 ENCOUNTER — Ambulatory Visit: Payer: BC Managed Care – PPO | Admitting: Family Medicine

## 2015-03-10 MED ORDER — OMEPRAZOLE 20 MG PO CPDR: DELAYED_RELEASE_CAPSULE | ORAL | Status: DC

## 2015-03-10 NOTE — Addendum Note (Signed)
Addended by: Kris Hartmann on: 03/10/2015 09:04 AM   Modules accepted: Orders

## 2015-03-10 NOTE — Telephone Encounter (Signed)
Med filled.  

## 2015-03-11 ENCOUNTER — Ambulatory Visit (INDEPENDENT_AMBULATORY_CARE_PROVIDER_SITE_OTHER): Payer: BLUE CROSS/BLUE SHIELD | Admitting: Family Medicine

## 2015-03-11 ENCOUNTER — Encounter: Payer: Self-pay | Admitting: Family Medicine

## 2015-03-11 VITALS — BP 118/82 | HR 98 | Temp 98.1°F | Resp 16 | Wt 258.4 lb

## 2015-03-11 DIAGNOSIS — Z720 Tobacco use: Secondary | ICD-10-CM

## 2015-03-11 DIAGNOSIS — Z87891 Personal history of nicotine dependence: Secondary | ICD-10-CM

## 2015-03-11 DIAGNOSIS — E559 Vitamin D deficiency, unspecified: Secondary | ICD-10-CM | POA: Diagnosis not present

## 2015-03-11 DIAGNOSIS — R5383 Other fatigue: Secondary | ICD-10-CM | POA: Insufficient documentation

## 2015-03-11 DIAGNOSIS — E785 Hyperlipidemia, unspecified: Secondary | ICD-10-CM

## 2015-03-11 NOTE — Progress Notes (Signed)
   Subjective:    Patient ID: [name], female    DOB: 29-Dec-1975, 39 y.o.   MRN: [id number]  HPI Hyperlipidemia- chronic problem, on Lipitor.  Denies abd pain, N/V, myalgias.  Limited exercise due to knee surgery.  No CP, SOB, HAs, visual changes.  Tobacco use- QUIT!  Last cigarette 09/17/14  Fatigue- pt w/ hx of B12 and Vit D deficiency.  Interested in repeating labs today.  Pt is not sure if fatigue is metabolic or due to deconditioning from surgery.   Review of Systems For ROS see HPI     Objective:   Physical Exam  Constitutional: She is oriented to person, place, and time. She appears well-developed and well-nourished. No distress.  HENT:  Head: Normocephalic and atraumatic.  Eyes: Conjunctivae and EOM are normal. Pupils are equal, round, and reactive to light.  Neck: Normal range of motion. Neck supple. No thyromegaly present.  Cardiovascular: Normal rate, regular rhythm, normal heart sounds and intact distal pulses.   No murmur heard. Pulmonary/Chest: Effort normal and breath sounds normal. No respiratory distress.  Abdominal: Soft. She exhibits no distension. There is no tenderness.  Musculoskeletal: She exhibits no edema.  Lymphadenopathy:    She has no cervical adenopathy.  Neurological: She is alert and oriented to person, place, and time.  Skin: Skin is warm and dry.  Psychiatric: She has a normal mood and affect. Her behavior is normal.  Vitals reviewed.         Assessment & Plan:

## 2015-03-11 NOTE — Patient Instructions (Signed)
Schedule your complete physical in 6 months We'll notify you of your lab results and make any changes if needed CONGRATS ON QUITTING SMOKING!!! Keep up the good work!  You look great! Call with any questions or concerns Happy Belated Birthday! Good luck w/ surgery!!!

## 2015-03-12 ENCOUNTER — Other Ambulatory Visit: Payer: Self-pay | Admitting: General Practice

## 2015-03-12 LAB — BASIC METABOLIC PANEL
BUN: 15 mg/dL (ref 6–23)
CALCIUM: 9.5 mg/dL (ref 8.4–10.5)
CO2: 27 meq/L (ref 19–32)
Chloride: 101 mEq/L (ref 96–112)
Creatinine, Ser: 0.75 mg/dL (ref 0.40–1.20)
GFR: 90.95 mL/min (ref >60.00)
Glucose, Bld: 73 mg/dL (ref 70–99)
POTASSIUM: 4.4 meq/L (ref 3.5–5.1)
SODIUM: 136 meq/L (ref 135–145)

## 2015-03-12 LAB — CBC WITH DIFFERENTIAL/PLATELET
BASOS ABS: 0.1 10*3/uL (ref 0.0–0.1)
Basophils Relative: 0.6 % (ref 0.0–3.0)
EOS ABS: 0.1 10*3/uL (ref 0.0–0.7)
Eosinophils Relative: 0.7 % (ref 0.0–5.0)
HEMATOCRIT: 39.8 % (ref 36.0–46.0)
HEMOGLOBIN: 13.4 g/dL (ref 12.0–15.0)
LYMPHS ABS: 2.3 10*3/uL (ref 0.7–4.0)
Lymphocytes Relative: 25.8 % (ref 12.0–46.0)
MCHC: 33.7 g/dL (ref 30.0–36.0)
MCV: 91.2 fl (ref 78.0–100.0)
MONOS PCT: 5.3 % (ref 3.0–12.0)
Monocytes Absolute: 0.5 10*3/uL (ref 0.1–1.0)
Neutro Abs: 5.9 10*3/uL (ref 1.4–7.7)
Neutrophils Relative %: 67.6 % (ref 43.0–77.0)
Platelets: 334 10*3/uL (ref 150.0–400.0)
RBC: 4.37 Mil/uL (ref 3.87–5.11)
RDW: 12.6 % (ref 11.5–15.5)
WBC: 8.7 10*3/uL (ref 4.0–10.5)

## 2015-03-12 LAB — TSH: TSH: 1.25 u[IU]/mL (ref 0.35–4.50)

## 2015-03-12 LAB — HEPATIC FUNCTION PANEL
ALT: 13 U/L (ref 0–35)
AST: 15 U/L (ref 0–37)
Albumin: 4.4 g/dL (ref 3.5–5.2)
Alkaline Phosphatase: 92 U/L (ref 39–117)
BILIRUBIN DIRECT: 0.1 mg/dL (ref 0.0–0.3)
Total Bilirubin: 0.3 mg/dL (ref 0.2–1.2)
Total Protein: 7.5 g/dL (ref 6.0–8.3)

## 2015-03-12 LAB — LIPID PANEL
CHOLESTEROL: 177 mg/dL (ref 0–200)
HDL: 66.9 mg/dL (ref >39.00)
LDL CALC: 76 mg/dL (ref 0–99)
NONHDL: 110.1
Total CHOL/HDL Ratio: 3
Triglycerides: 169 mg/dL — ABNORMAL HIGH (ref 0.0–149.0)
VLDL: 33.8 mg/dL (ref 0.0–40.0)

## 2015-03-12 LAB — B12 AND FOLATE PANEL
Folate: 12.9 ng/mL (ref >5.9)
VITAMIN B 12: 684 pg/mL (ref 211–911)

## 2015-03-12 LAB — VITAMIN D 25 HYDROXY (VIT D DEFICIENCY, FRACTURES): VITD: 16.55 ng/mL — ABNORMAL LOW (ref 30.00–100.00)

## 2015-03-12 MED ORDER — VITAMIN D (ERGOCALCIFEROL) 1.25 MG (50000 UNIT) PO CAPS: 50000.0000 [IU] | ORAL_CAPSULE | ORAL | Status: DC

## 2015-03-15 NOTE — Assessment & Plan Note (Signed)
New.  Unclear if this is metabolic or deconditioning due to pt's recent surgery.  Check labs.  Will address abnormalities if present.  Pt expressed understanding and is in agreement w/ plan.

## 2015-03-15 NOTE — Assessment & Plan Note (Signed)
Chronic problem.  Tolerating statin w/o difficulty.  Check labs.  Adjust meds prn  

## 2015-03-15 NOTE — Assessment & Plan Note (Signed)
Pt has hx of this.  Check labs and replete prn as this may be contributing to pt's fatigue.

## 2015-03-15 NOTE — Assessment & Plan Note (Signed)
New.  Applauded pt's efforts.

## 2015-03-20 ENCOUNTER — Other Ambulatory Visit: Payer: Self-pay | Admitting: General Practice

## 2015-03-20 MED ORDER — ATORVASTATIN CALCIUM 20 MG PO TABS: 20.0000 mg | ORAL_TABLET | Freq: Every day | ORAL | Status: DC

## 2015-04-21 ENCOUNTER — Other Ambulatory Visit: Payer: Self-pay | Admitting: Physician Assistant

## 2015-04-21 DIAGNOSIS — M5134 Other intervertebral disc degeneration, thoracic region: Secondary | ICD-10-CM

## 2015-05-01 ENCOUNTER — Other Ambulatory Visit: Payer: BLUE CROSS/BLUE SHIELD

## 2015-05-11 ENCOUNTER — Ambulatory Visit
Admission: RE | Admit: 2015-05-11 | Discharge: 2015-05-11 | Disposition: A | Discharge status: Home / Self Care | Payer: BLUE CROSS/BLUE SHIELD | Source: Ambulatory Visit | Attending: Physician Assistant | Admitting: Physician Assistant

## 2015-05-11 DIAGNOSIS — M5134 Other intervertebral disc degeneration, thoracic region: Secondary | ICD-10-CM

## 2015-05-11 MED ORDER — METHYLPREDNISOLONE ACETATE 40 MG/ML INJ SUSP (RADIOLOG
120.0000 mg | Freq: Once | INTRAMUSCULAR | Status: AC
  Administered 2015-05-11: 120 mg via EPIDURAL

## 2015-05-11 MED ORDER — IOHEXOL 180 MG/ML  SOLN
1.0000 mL | Freq: Once | INTRAMUSCULAR | Status: AC | PRN
  Administered 2015-05-11: 1 mL via EPIDURAL

## 2015-05-11 NOTE — Discharge Instructions (Signed)

## 2015-07-04 ENCOUNTER — Other Ambulatory Visit: Payer: Self-pay | Admitting: Family Medicine

## 2015-07-06 NOTE — Telephone Encounter (Signed)
Med denied, pt needs to continue OTC vitamin D 2000iu daily.  

## 2015-07-21 ENCOUNTER — Ambulatory Visit: Payer: BLUE CROSS/BLUE SHIELD | Admitting: Neurology

## 2015-07-22 ENCOUNTER — Ambulatory Visit (INDEPENDENT_AMBULATORY_CARE_PROVIDER_SITE_OTHER): Payer: BLUE CROSS/BLUE SHIELD | Admitting: Neurology

## 2015-07-22 ENCOUNTER — Encounter: Payer: Self-pay | Admitting: Neurology

## 2015-07-22 VITALS — BP 110/80 | HR 67 | Ht 66.0 in | Wt 252.6 lb

## 2015-07-22 DIAGNOSIS — G43019 Migraine without aura, intractable, without status migrainosus: Secondary | ICD-10-CM

## 2015-07-22 DIAGNOSIS — H539 Unspecified visual disturbance: Secondary | ICD-10-CM

## 2015-07-22 DIAGNOSIS — M542 Cervicalgia: Secondary | ICD-10-CM | POA: Diagnosis not present

## 2015-07-22 MED ORDER — PREGABALIN 75 MG PO CAPS: 75.0000 mg | ORAL_CAPSULE | Freq: Two times a day (BID) | ORAL | Status: DC

## 2015-07-22 MED ORDER — SUMATRIPTAN SUCCINATE 100 MG PO TABS: 100.0000 mg | ORAL_TABLET | Freq: Two times a day (BID) | ORAL | Status: DC | PRN

## 2015-07-22 NOTE — Patient Instructions (Addendum)
1.  Increase Lyrica 75mg  twice daily.  If you develop worsening vision changes, stop the Lyrica.   2.  Limit imitrex to twice per week or 9 days per month 3.  Return to clinic in 1 year, or sooner as needed
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
## 2015-08-25 ENCOUNTER — Encounter: Payer: Self-pay | Admitting: Family Medicine

## 2015-09-14 ENCOUNTER — Telehealth: Payer: Self-pay | Admitting: Behavioral Health

## 2015-09-14 NOTE — Telephone Encounter (Signed)
Unable to reach patient at time of Pre-Visit Call.  Left message for patient to return call when available.    

## 2015-09-15 ENCOUNTER — Ambulatory Visit (INDEPENDENT_AMBULATORY_CARE_PROVIDER_SITE_OTHER): Payer: BLUE CROSS/BLUE SHIELD | Admitting: Family Medicine

## 2015-09-15 ENCOUNTER — Encounter: Payer: Self-pay | Admitting: Family Medicine

## 2015-09-15 VITALS — BP 126/80 | HR 90 | Temp 97.8°F | Ht 65.5 in | Wt 259.0 lb

## 2015-09-15 DIAGNOSIS — Z1231 Encounter for screening mammogram for malignant neoplasm of breast: Secondary | ICD-10-CM | POA: Diagnosis not present

## 2015-09-15 DIAGNOSIS — Z0000 Encounter for general adult medical examination without abnormal findings: Secondary | ICD-10-CM

## 2015-09-15 LAB — CBC WITH DIFFERENTIAL/PLATELET
BASOS PCT: 0.8 % (ref 0.0–3.0)
Basophils Absolute: 0 10*3/uL (ref 0.0–0.1)
EOS ABS: 0.1 10*3/uL (ref 0.0–0.7)
Eosinophils Relative: 1.2 % (ref 0.0–5.0)
HCT: 36.6 % (ref 36.0–46.0)
Hemoglobin: 12.4 g/dL (ref 12.0–15.0)
Lymphocytes Relative: 29.8 % (ref 12.0–46.0)
Lymphs Abs: 1.9 10*3/uL (ref 0.7–4.0)
MCHC: 33.9 g/dL (ref 30.0–36.0)
MCV: 90.5 fl (ref 78.0–100.0)
MONO ABS: 0.5 10*3/uL (ref 0.1–1.0)
Monocytes Relative: 7.7 % (ref 3.0–12.0)
NEUTROS ABS: 3.8 10*3/uL (ref 1.4–7.7)
Neutrophils Relative %: 60.5 % (ref 43.0–77.0)
PLATELETS: 314 10*3/uL (ref 150.0–400.0)
RBC: 4.04 Mil/uL (ref 3.87–5.11)
RDW: 12.5 % (ref 11.5–15.5)
WBC: 6.3 10*3/uL (ref 4.0–10.5)

## 2015-09-15 LAB — BASIC METABOLIC PANEL
BUN: 14 mg/dL (ref 6–23)
CALCIUM: 8.9 mg/dL (ref 8.4–10.5)
CO2: 28 meq/L (ref 19–32)
Chloride: 101 mEq/L (ref 96–112)
Creatinine, Ser: 0.65 mg/dL (ref 0.40–1.20)
GFR: 107.01 mL/min (ref >60.00)
Glucose, Bld: 94 mg/dL (ref 70–99)
Potassium: 4.5 mEq/L (ref 3.5–5.1)
Sodium: 135 mEq/L (ref 135–145)

## 2015-09-15 LAB — LIPID PANEL
CHOLESTEROL: 134 mg/dL (ref 0–200)
HDL: 47.6 mg/dL (ref >39.00)
LDL CALC: 54 mg/dL (ref 0–99)
NonHDL: 86.49
TRIGLYCERIDES: 161 mg/dL — AB (ref 0.0–149.0)
Total CHOL/HDL Ratio: 3
VLDL: 32.2 mg/dL (ref 0.0–40.0)

## 2015-09-15 LAB — HEPATIC FUNCTION PANEL
ALT: 14 U/L (ref 0–35)
AST: 21 U/L (ref 0–37)
Albumin: 3.9 g/dL (ref 3.5–5.2)
Alkaline Phosphatase: 87 U/L (ref 39–117)
BILIRUBIN DIRECT: 0 mg/dL (ref 0.0–0.3)
BILIRUBIN TOTAL: 0.3 mg/dL (ref 0.2–1.2)
Total Protein: 6.7 g/dL (ref 6.0–8.3)

## 2015-09-15 LAB — VITAMIN D 25 HYDROXY (VIT D DEFICIENCY, FRACTURES): VITD: 19.87 ng/mL — AB (ref 30.00–100.00)

## 2015-09-15 LAB — TSH: TSH: 2.91 u[IU]/mL (ref 0.35–4.50)

## 2015-09-15 MED ORDER — OMEPRAZOLE 20 MG PO CPDR: DELAYED_RELEASE_CAPSULE | ORAL | Status: DC

## 2015-09-15 MED ORDER — ATORVASTATIN CALCIUM 20 MG PO TABS
20.0000 mg | ORAL_TABLET | Freq: Every day | ORAL | Status: DC
Start: 2015-09-15 — End: 2016-08-25

## 2015-09-15 NOTE — Assessment & Plan Note (Signed)
Ongoing issue for pt.  She just had knee surgery and is looking forward to increasing her exercise.  Discussed importance of healthy diet.  Check labs to risk stratify.  Will follow.

## 2015-09-15 NOTE — Progress Notes (Signed)
Pre visit review using our clinic review tool, if applicable. No additional management support is needed unless otherwise documented below in the visit note. 

## 2015-09-15 NOTE — Assessment & Plan Note (Signed)
Pt's PE WNL w/ exception of obesity.  Pt due for mammo- order entered.  No need for paps.  Check labs.  Anticipatory guidance provided.

## 2015-09-15 NOTE — Patient Instructions (Addendum)
Follow up in 6 months to recheck cholesterol We'll notify you of your lab results and make any changes if needed Continue to work on healthy diet and regular exercise- you can do it!! We'll call you with your mammogram appt Call with any questions or concerns If you want to join [place] at the new [place] office, any scheduled appointments will automatically transfer and we will see you at [address] [place] [address] [place], [place] [address] ([place] 10/13/15) Happy Holidays!!!

## 2015-09-15 NOTE — Progress Notes (Signed)
   Subjective:    Patient ID: [name], female    DOB: 03-14-1976, 39 y.o.   MRN: [id number]  HPI CPE- no need for pap due to hysterectomy.  Just turned 39- due for mammo.  UTD on flu shot   Review of Systems Patient reports no vision/ hearing changes, adenopathy,fever, weight change,  persistant/recurrent hoarseness , swallowing issues, chest pain, palpitations, edema, persistant/recurrent cough, hemoptysis, dyspnea (rest/exertional/paroxysmal nocturnal), gastrointestinal bleeding (melena, rectal bleeding), abdominal pain, significant heartburn, bowel changes, GU symptoms (dysuria, hematuria, incontinence), Gyn symptoms (abnormal  bleeding, pain),  syncope, focal weakness, memory loss, numbness & tingling, skin/hair/nail changes, abnormal bruising or bleeding, anxiety, or depression.     Objective:   Physical Exam General Appearance:    Alert, cooperative, no distress, appears stated age  Head:    Normocephalic, without obvious abnormality, atraumatic  Eyes:    PERRL, conjunctiva/corneas clear, EOM's intact, fundi    benign, both eyes  Ears:    Normal TM's and external ear canals, both ears  Nose:   Nares normal, septum midline, mucosa normal, no drainage    or sinus tenderness  Throat:   Lips, mucosa, and tongue normal; teeth and gums normal  Neck:   Supple, symmetrical, trachea midline, no adenopathy;    Thyroid: no enlargement/tenderness/nodules  Back:     Symmetric, no curvature, ROM normal, no CVA tenderness  Lungs:     Clear to auscultation bilaterally, respirations unlabored  Chest Wall:    No tenderness or deformity   Heart:    Regular rate and rhythm, S1 and S2 normal, no murmur, rub   or gallop  Breast Exam:    Deferred to mammo  Abdomen:     Soft, non-tender, bowel sounds active all four quadrants,    no masses, no organomegaly  Genitalia:    Deferred  Rectal:    Extremities:   Extremities normal, atraumatic, no cyanosis or edema  Pulses:   2+ and symmetric all  extremities  Skin:   Skin color, texture, turgor normal, no rashes or lesions  Lymph nodes:   Cervical, supraclavicular, and axillary nodes normal  Neurologic:   CNII-XII intact, normal strength, sensation and reflexes    throughout          Assessment & Plan:

## 2015-09-16 ENCOUNTER — Other Ambulatory Visit: Payer: Self-pay | Admitting: General Practice

## 2015-09-16 MED ORDER — VITAMIN D (ERGOCALCIFEROL) 1.25 MG (50000 UNIT) PO CAPS: 50000.0000 [IU] | ORAL_CAPSULE | ORAL | Status: DC

## 2015-11-11 ENCOUNTER — Other Ambulatory Visit: Payer: Self-pay | Admitting: Family Medicine

## 2015-11-11 DIAGNOSIS — Z1231 Encounter for screening mammogram for malignant neoplasm of breast: Secondary | ICD-10-CM

## 2015-11-30 ENCOUNTER — Ambulatory Visit
Admission: RE | Admit: 2015-11-30 | Discharge: 2015-11-30 | Disposition: A | Discharge status: Home / Self Care | Payer: BLUE CROSS/BLUE SHIELD | Source: Ambulatory Visit | Attending: Family Medicine | Admitting: Family Medicine

## 2015-11-30 DIAGNOSIS — Z1231 Encounter for screening mammogram for malignant neoplasm of breast: Secondary | ICD-10-CM

## 2015-12-04 ENCOUNTER — Other Ambulatory Visit: Payer: Self-pay | Admitting: Family Medicine

## 2015-12-04 DIAGNOSIS — R928 Other abnormal and inconclusive findings on diagnostic imaging of breast: Secondary | ICD-10-CM

## 2015-12-08 ENCOUNTER — Telehealth: Payer: Self-pay | Admitting: *Deleted

## 2015-12-08 NOTE — Telephone Encounter (Signed)
Received Physician Order; forwarded to provider/SLS 02/28

## 2015-12-09 ENCOUNTER — Other Ambulatory Visit: Payer: Self-pay | Admitting: *Deleted

## 2015-12-10 ENCOUNTER — Other Ambulatory Visit: Payer: Self-pay | Admitting: Family Medicine

## 2015-12-10 ENCOUNTER — Other Ambulatory Visit: Payer: Self-pay

## 2015-12-10 DIAGNOSIS — R928 Other abnormal and inconclusive findings on diagnostic imaging of breast: Secondary | ICD-10-CM

## 2015-12-11 ENCOUNTER — Other Ambulatory Visit: Payer: Self-pay | Admitting: Family Medicine

## 2015-12-11 ENCOUNTER — Ambulatory Visit
Admission: RE | Admit: 2015-12-11 | Discharge: 2015-12-11 | Disposition: A | Discharge status: Home / Self Care | Payer: BLUE CROSS/BLUE SHIELD | Source: Ambulatory Visit | Attending: Family Medicine | Admitting: Family Medicine

## 2015-12-11 DIAGNOSIS — N6320 Unspecified lump in the left breast, unspecified quadrant: Secondary | ICD-10-CM

## 2015-12-11 DIAGNOSIS — R928 Other abnormal and inconclusive findings on diagnostic imaging of breast: Secondary | ICD-10-CM

## 2015-12-28 ENCOUNTER — Ambulatory Visit
Admission: RE | Admit: 2015-12-28 | Discharge: 2015-12-28 | Disposition: A | Discharge status: Home / Self Care | Payer: BLUE CROSS/BLUE SHIELD | Source: Ambulatory Visit | Attending: Family Medicine | Admitting: Family Medicine

## 2015-12-28 ENCOUNTER — Other Ambulatory Visit: Payer: Self-pay | Admitting: Family Medicine

## 2015-12-28 DIAGNOSIS — N6320 Unspecified lump in the left breast, unspecified quadrant: Secondary | ICD-10-CM

## 2015-12-28 HISTORY — PX: BREAST BIOPSY: SHX20

## 2016-01-23 ENCOUNTER — Other Ambulatory Visit: Payer: Self-pay | Admitting: Neurology

## 2016-01-25 ENCOUNTER — Other Ambulatory Visit: Payer: Self-pay | Admitting: *Deleted

## 2016-01-25 NOTE — Telephone Encounter (Signed)
Rx declined.  Too soon to refill.

## 2016-01-26 ENCOUNTER — Other Ambulatory Visit: Payer: Self-pay | Admitting: Neurology

## 2016-01-26 NOTE — Telephone Encounter (Signed)
Rx faxed.  Called patient and [facility]. Last refill was December 21, 2015.

## 2016-02-11 IMAGING — CT CT CERVICAL SPINE W/ CM
5 series · 16 of 33 positions shown, 18 images · non-contrast
Comparison: none

CLINICAL DATA: Neck pain.  Cracking.  Previous cervical fusion.
TECHNIQUE: Contiguous axial images were obtained through the Cervical spine
after the intrathecal infusion of infusion. Coronal and sagittal
reconstructions were obtained of the axial image sets.

[Series 2: c spine bone · axial · 0.23mm/px · z∈[-164,-107]mm · 2 of 70 slices shown]
[im 24/70  bone]
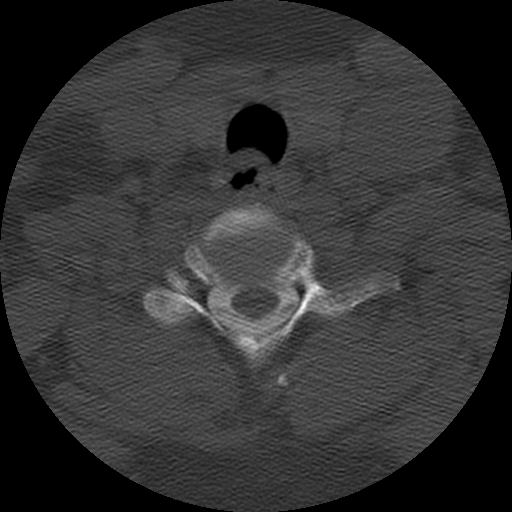
[im 47/70  bone]
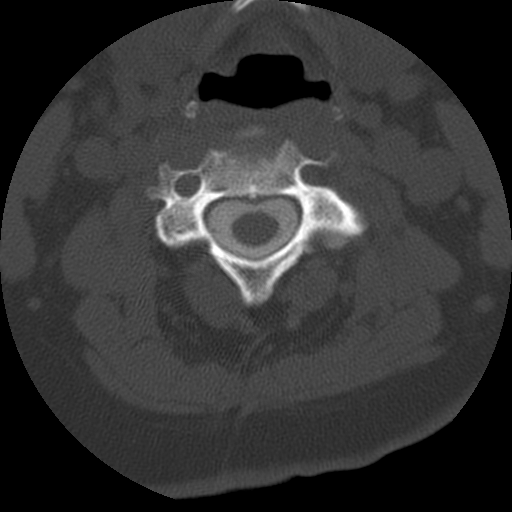

[Series 3: c spine soft · axial · 0.23mm/px · z∈[-164,-107]mm · 2 of 70 slices shown]
[im 24/70  soft-tissue]
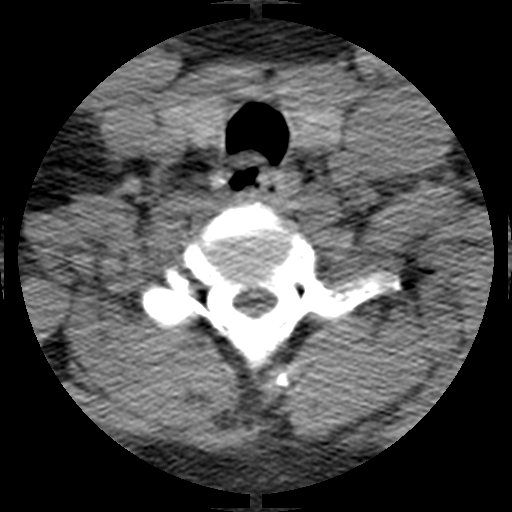
[im 47/70  soft-tissue]
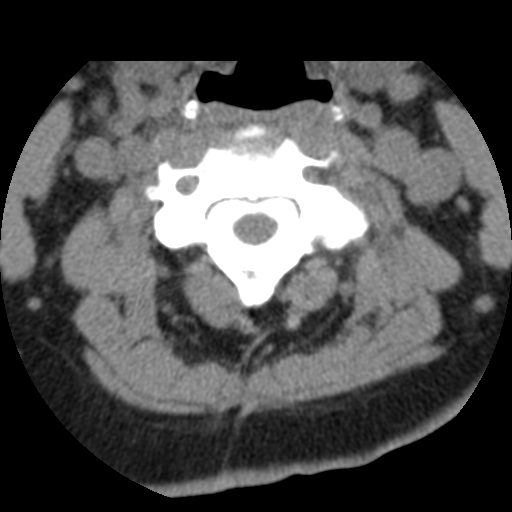

[Series 400: cor · coronal · 0.35mm/px · 3 of 38 slices shown]
[im 8/38  bone]
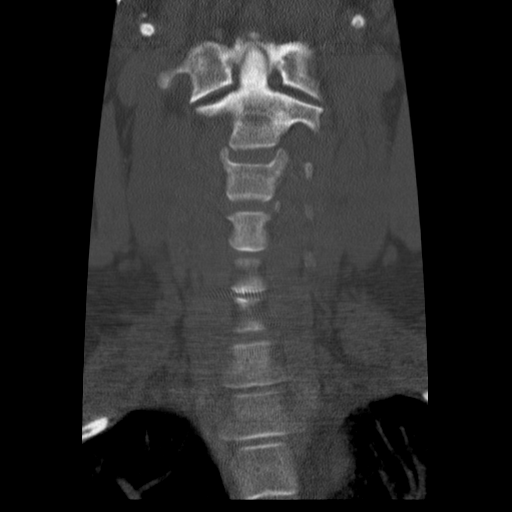
[im 15/38  bone]
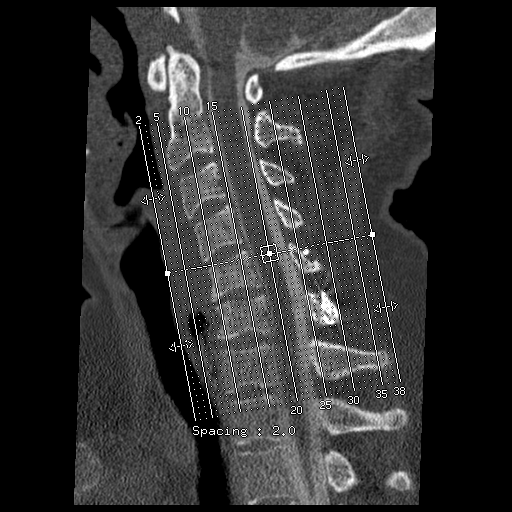
[im 23/38  bone]
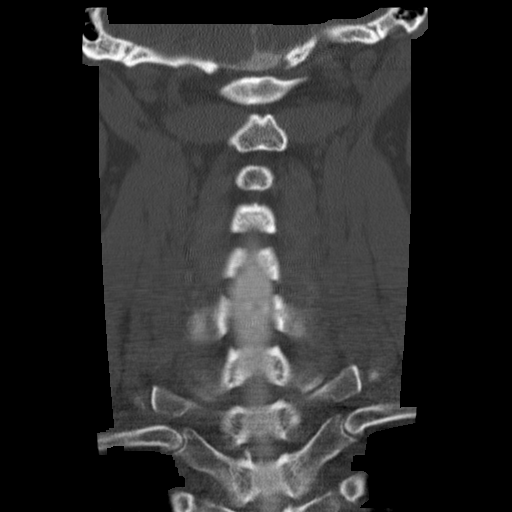

[Series 401: sag · sagittal · 0.35mm/px · 5 of 38 slices shown, 6 images]
[im 13/38  bone]
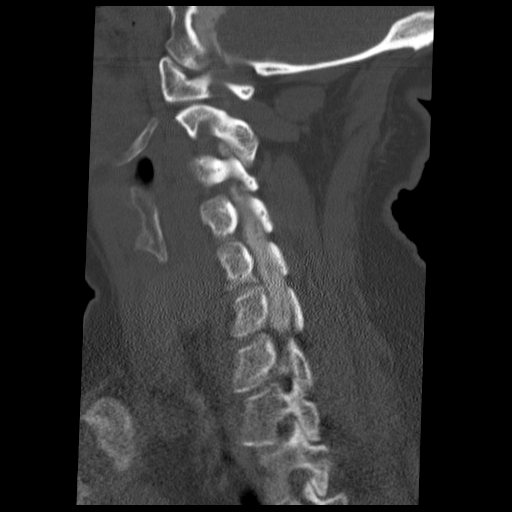
[im 16/38  bone]
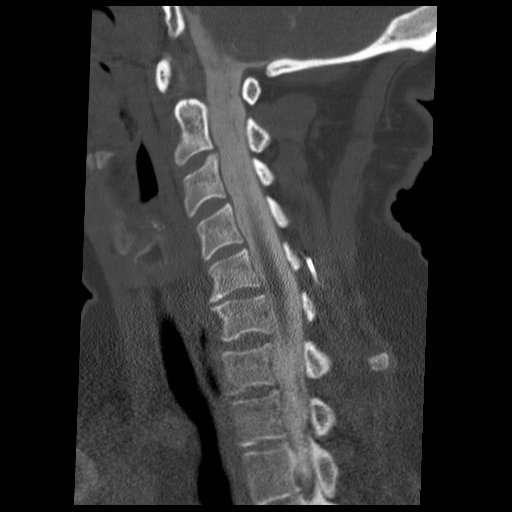
[im 19/38  soft-tissue]
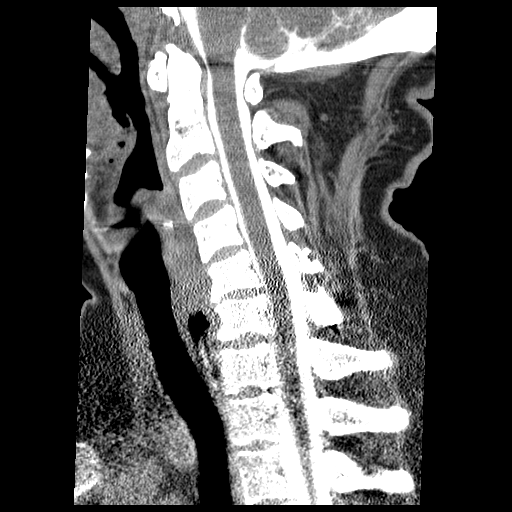
[im 19/38  bone]
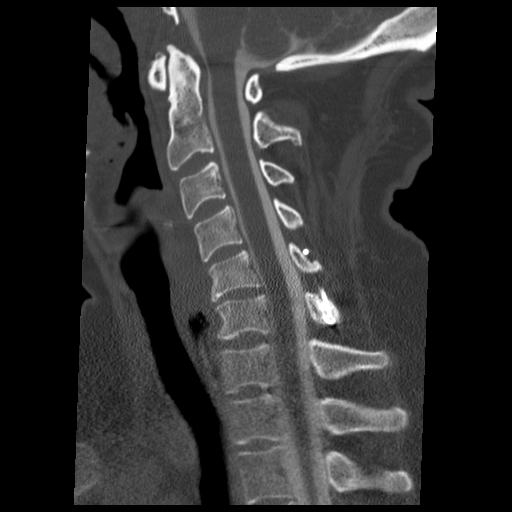
[im 22/38  bone]
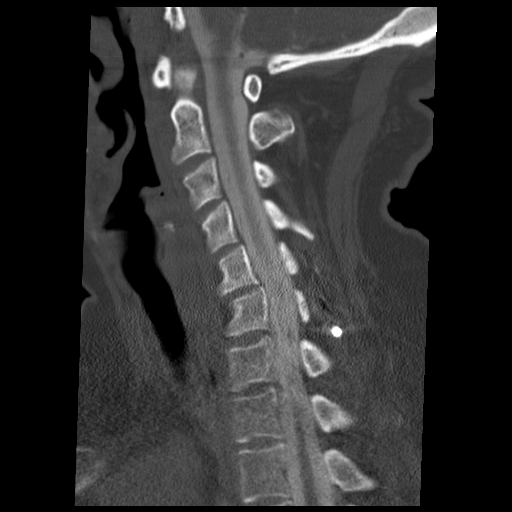
[im 25/38  bone]
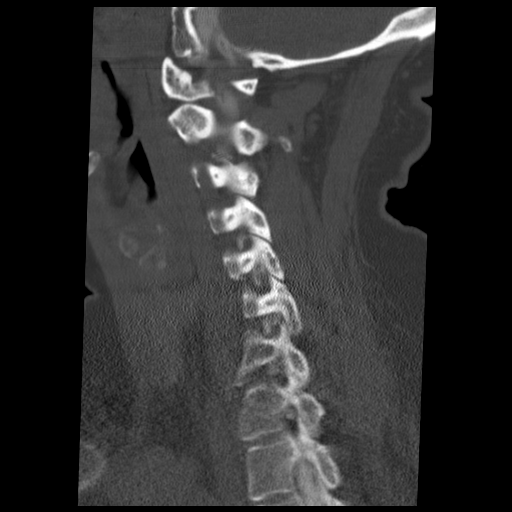

[Series 402: axial · axial · 0.23mm/px · z∈[-209,-97]mm · 4 of 99 slices shown, 5 images]
[im 20/99  soft-tissue]
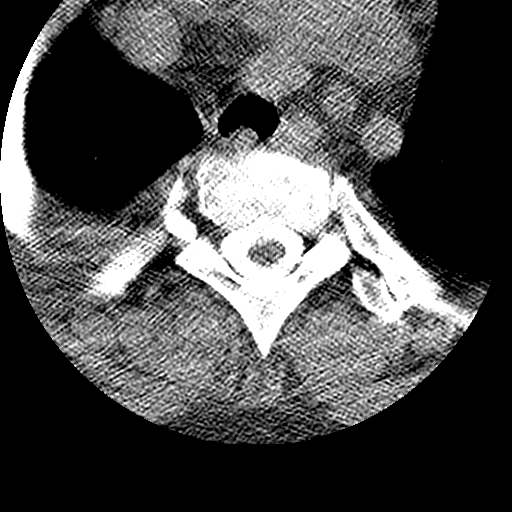
[im 20/99  bone]
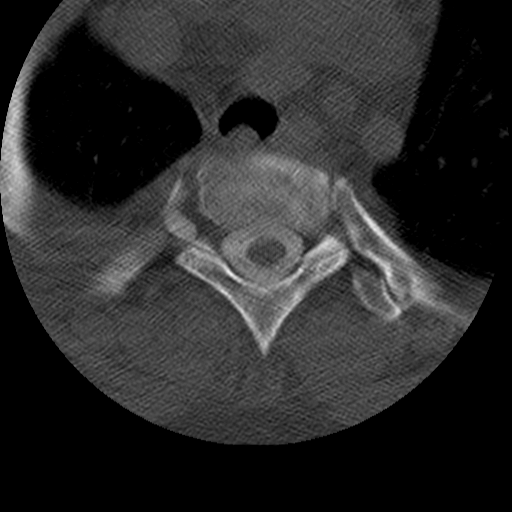
[im 40/99  bone]
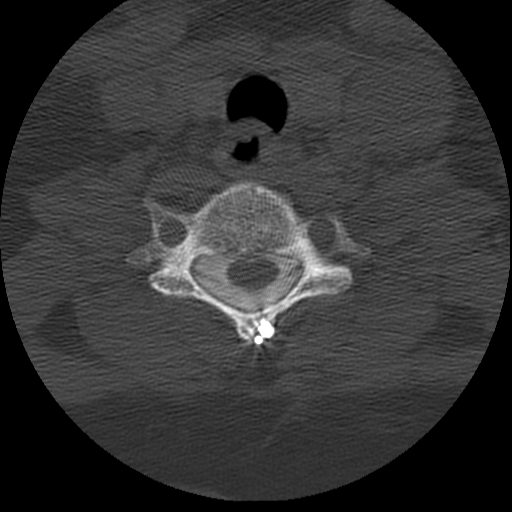
[im 59/99  bone]
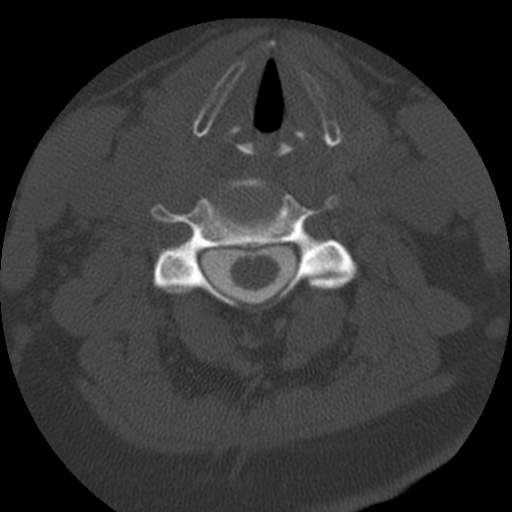
[im 79/99  bone]
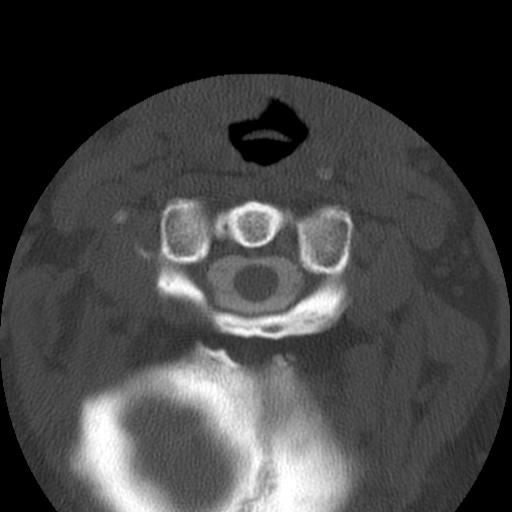

[16 of 33 positions shown; findings below may reference images not displayed]

FLUOROSCOPY TIME:  1 min 7 seconds

PROCEDURE:
LUMBAR PUNCTURE FOR CERVICAL MYELOGRAM

After thorough discussion of risks and benefits of the procedure
including bleeding, infection, injury to nerves, blood vessels,
adjacent structures as well as headache and CSF leak, written and
oral informed consent was obtained. Consent was obtained by Dr. Silvera
Sumesh. We discussed the high likelihood of obtaining a diagnostic
study.

Patient was positioned prone on the fluoroscopy table. Local
anesthesia was provided with 1% lidocaine without epinephrine after
prepped and draped in the usual sterile fashion. Puncture was
performed at L3-4 using a 3 1/2 inch 22-gauge spinal needle via left
approach. Using a single pass through the dura, the needle was
placed within the thecal sac, with return of clear CSF. 10 mL of
Amnipaque-1GG was injected into the thecal sac, with normal
opacification of the nerve roots and cauda equina consistent with
free flow within the subarachnoid space. The patient was then moved
to the trendelenburg position and contrast flowed into the Cervical
spine region.

I personally performed the lumbar puncture and administered the
intrathecal contrast. I also personally performed acquisition of the
myelogram images.
FINDINGS: CERVICAL MYELOGRAM FINDINGS:

The posterior element cerclage device of the spinous processes of C5
and C6 has broken or slipped and is no longer competent.

There is no central canal stenosis. No evidence of focal root sleeve
compression. Root sleeve cysts are present bilaterally at the C5, C6
and C7 root sleeves.

CT CERVICAL MYELOGRAM FINDINGS:

Has above, the cerclage device at the spinous processes of C5 and C6
has become discontinuous.

Foramen magnum: Widely patent.

C1-2: Mild osteoarthritis.  No stenosis.

C2-3: No disc pathology. Facet degeneration on the left but without
encroachment upon the canal or foramina.

C3-4:  No disc pathology.  No facet arthropathy.  No stenosis.

C4-5:  Normal interspace.

C5-6: Discontinuous posterior cerclage wire as described above. Mild
kyphotic angulation at this level. Minimal endplate osteophytes/
bulging of the disc. No stenosis of the canal or foramina.

C6-7:  No disc pathology or facet pathology.  No stenosis.

C7-T1:  Normal interspace.
IMPRESSION: The posterior elements cerclage wire at the spinous processes of C5
and C6 is no longer continuous, the right-side end having slipped
from the securing device.

Slight kyphotic angulation at the C5-6 level but without compromise
of the canal or foramina.

Mild facet osteoarthritis on the left at C2-3 without encroachment
upon the neural spaces.

## 2016-03-15 ENCOUNTER — Ambulatory Visit (INDEPENDENT_AMBULATORY_CARE_PROVIDER_SITE_OTHER): Payer: 59 | Admitting: Family Medicine

## 2016-03-15 ENCOUNTER — Encounter: Payer: Self-pay | Admitting: Family Medicine

## 2016-03-15 VITALS — BP 126/80 | HR 76 | Temp 98.3°F | Resp 16 | Ht 66.0 in | Wt 234.4 lb

## 2016-03-15 DIAGNOSIS — R238 Other skin changes: Secondary | ICD-10-CM

## 2016-03-15 DIAGNOSIS — R233 Spontaneous ecchymoses: Secondary | ICD-10-CM

## 2016-03-15 DIAGNOSIS — E785 Hyperlipidemia, unspecified: Secondary | ICD-10-CM

## 2016-03-15 DIAGNOSIS — E559 Vitamin D deficiency, unspecified: Secondary | ICD-10-CM | POA: Diagnosis not present

## 2016-03-15 LAB — BASIC METABOLIC PANEL
BUN: 18 mg/dL (ref 6–23)
CHLORIDE: 102 meq/L (ref 96–112)
CO2: 28 meq/L (ref 19–32)
CREATININE: 0.59 mg/dL (ref 0.40–1.20)
Calcium: 9.3 mg/dL (ref 8.4–10.5)
GFR: 119.36 mL/min (ref >60.00)
Glucose, Bld: 96 mg/dL (ref 70–99)
POTASSIUM: 4.3 meq/L (ref 3.5–5.1)
Sodium: 135 mEq/L (ref 135–145)

## 2016-03-15 LAB — CBC WITH DIFFERENTIAL/PLATELET
BASOS ABS: 0.1 10*3/uL (ref 0.0–0.1)
Basophils Relative: 0.9 % (ref 0.0–3.0)
EOS PCT: 0.8 % (ref 0.0–5.0)
Eosinophils Absolute: 0 10*3/uL (ref 0.0–0.7)
HEMATOCRIT: 38 % (ref 36.0–46.0)
HEMOGLOBIN: 13 g/dL (ref 12.0–15.0)
LYMPHS ABS: 1.5 10*3/uL (ref 0.7–4.0)
LYMPHS PCT: 24.5 % (ref 12.0–46.0)
MCHC: 34.1 g/dL (ref 30.0–36.0)
MCV: 92.1 fl (ref 78.0–100.0)
MONOS PCT: 6.4 % (ref 3.0–12.0)
Monocytes Absolute: 0.4 10*3/uL (ref 0.1–1.0)
Neutro Abs: 4.2 10*3/uL (ref 1.4–7.7)
Neutrophils Relative %: 67.4 % (ref 43.0–77.0)
Platelets: 291 10*3/uL (ref 150.0–400.0)
RBC: 4.13 Mil/uL (ref 3.87–5.11)
RDW: 12.9 % (ref 11.5–15.5)
WBC: 6.2 10*3/uL (ref 4.0–10.5)

## 2016-03-15 LAB — LIPID PANEL
CHOL/HDL RATIO: 3
CHOLESTEROL: 205 mg/dL — AB (ref 0–200)
HDL: 62 mg/dL (ref >39.00)
NONHDL: 143.25
TRIGLYCERIDES: 202 mg/dL — AB (ref 0.0–149.0)
VLDL: 40.4 mg/dL — AB (ref 0.0–40.0)

## 2016-03-15 LAB — HEPATIC FUNCTION PANEL
ALBUMIN: 4.2 g/dL (ref 3.5–5.2)
ALK PHOS: 80 U/L (ref 39–117)
ALT: 20 U/L (ref 0–35)
AST: 15 U/L (ref 0–37)
Bilirubin, Direct: 0.1 mg/dL (ref 0.0–0.3)
TOTAL PROTEIN: 6.6 g/dL (ref 6.0–8.3)
Total Bilirubin: 0.3 mg/dL (ref 0.2–1.2)

## 2016-03-15 LAB — TSH: TSH: 1.19 u[IU]/mL (ref 0.35–4.50)

## 2016-03-15 LAB — VITAMIN D 25 HYDROXY (VIT D DEFICIENCY, FRACTURES): VITD: 19.44 ng/mL — ABNORMAL LOW (ref 30.00–100.00)

## 2016-03-15 LAB — PROTIME-INR
INR: 1 ratio (ref 0.8–1.0)
PROTHROMBIN TIME: 10.8 s (ref 9.6–13.1)

## 2016-03-15 LAB — LDL CHOLESTEROL, DIRECT: LDL DIRECT: 110 mg/dL

## 2016-03-15 NOTE — Progress Notes (Signed)
   Subjective:    Patient ID: [name], female    DOB: September 10, 1976, 40 y.o.   MRN: [id number]  HPI Hyperlipidemia- chronic problem, on Lipitor daily.  Pt has lost 25 lbs since December!  Exercising regularly, doing Isagenix.  Pt reports feeling good- denies CP, SOB, HAs, visual changes, abd pain, N/V.  Vit D deficiency- pt has had hx of this and asking for recheck today  Increased bruising- pt reports sxs started a 'few weeks ago'.  Increased bruising on arms, legs but large bruise on R flank.  Review of Systems For ROS see HPI     Objective:   Physical Exam  Constitutional: She is oriented to person, place, and time. She appears well-developed and well-nourished. No distress.  HENT:  Head: Normocephalic and atraumatic.  Eyes: Conjunctivae and EOM are normal. Pupils are equal, round, and reactive to light.  Neck: Normal range of motion. Neck supple. No thyromegaly present.  Cardiovascular: Normal rate, regular rhythm, normal heart sounds and intact distal pulses.   No murmur heard. Pulmonary/Chest: Effort normal and breath sounds normal. No respiratory distress.  Abdominal: Soft. She exhibits no distension. There is no tenderness.  Musculoskeletal: She exhibits no edema.  Lymphadenopathy:    She has no cervical adenopathy.  Neurological: She is alert and oriented to person, place, and time.  Skin: Skin is warm and dry.  Large ecchymosis on R flank  Psychiatric: She has a normal mood and affect. Her behavior is normal.          Assessment & Plan:

## 2016-03-15 NOTE — Assessment & Plan Note (Signed)
Chronic problem.  Tolerating statin w/o difficulty.  Applauded her efforts on healthy diet and regular exercise.  Check labs.  Adjust meds prn

## 2016-03-15 NOTE — Progress Notes (Signed)
Pre visit review using our clinic review tool, if applicable. No additional management support is needed unless otherwise documented below in the visit note. 

## 2016-03-15 NOTE — Assessment & Plan Note (Signed)
Pt has hx of similar.  Now w/ increased bruising.  Repeat Vit D level and replete prn.

## 2016-03-15 NOTE — Assessment & Plan Note (Signed)
New.  Pt does not recall any injury.  Not on ASA.  Check labs.  Will follow closely.

## 2016-03-15 NOTE — Patient Instructions (Signed)
Schedule your complete physical in 6 months We'll notify you of your lab results and make any changes if needed Keep up the good work on healthy diet and regular exercise- you look great! Call with any questions or concerns Thanks for sticking with us!!! Have a great summer!!! 

## 2016-03-16 ENCOUNTER — Other Ambulatory Visit: Payer: Self-pay | Admitting: General Practice

## 2016-03-16 MED ORDER — VITAMIN D (ERGOCALCIFEROL) 1.25 MG (50000 UNIT) PO CAPS: 50000.0000 [IU] | ORAL_CAPSULE | ORAL | Status: DC

## 2016-04-08 ENCOUNTER — Encounter: Payer: Self-pay | Admitting: Neurology

## 2016-05-25 ENCOUNTER — Other Ambulatory Visit: Payer: Self-pay | Admitting: Family Medicine

## 2016-05-25 DIAGNOSIS — N63 Unspecified lump in unspecified breast: Secondary | ICD-10-CM

## 2016-06-16 ENCOUNTER — Ambulatory Visit
Admission: RE | Admit: 2016-06-16 | Discharge: 2016-06-16 | Disposition: A | Discharge status: Home / Self Care | Payer: 59 | Source: Ambulatory Visit | Attending: Family Medicine | Admitting: Family Medicine

## 2016-06-16 ENCOUNTER — Other Ambulatory Visit: Payer: Self-pay | Admitting: Family Medicine

## 2016-06-16 DIAGNOSIS — N63 Unspecified lump in unspecified breast: Secondary | ICD-10-CM

## 2016-06-21 ENCOUNTER — Other Ambulatory Visit: Payer: Self-pay | Admitting: *Deleted

## 2016-06-21 ENCOUNTER — Encounter: Payer: Self-pay | Admitting: Neurology

## 2016-06-21 MED ORDER — PREGABALIN 75 MG PO CAPS: 75.0000 mg | ORAL_CAPSULE | Freq: Two times a day (BID) | ORAL | 2 refills | Status: DC

## 2016-07-14 ENCOUNTER — Encounter: Payer: Self-pay | Admitting: Neurology

## 2016-07-21 ENCOUNTER — Ambulatory Visit: Payer: BLUE CROSS/BLUE SHIELD | Admitting: Neurology

## 2016-08-25 ENCOUNTER — Ambulatory Visit (INDEPENDENT_AMBULATORY_CARE_PROVIDER_SITE_OTHER): Payer: 59 | Admitting: Neurology

## 2016-08-25 ENCOUNTER — Encounter: Payer: Self-pay | Admitting: Neurology

## 2016-08-25 VITALS — BP 120/84 | HR 84 | Ht 66.0 in | Wt 231.1 lb

## 2016-08-25 DIAGNOSIS — Z981 Arthrodesis status: Secondary | ICD-10-CM | POA: Diagnosis not present

## 2016-08-25 DIAGNOSIS — G43009 Migraine without aura, not intractable, without status migrainosus: Secondary | ICD-10-CM

## 2016-08-25 DIAGNOSIS — M542 Cervicalgia: Secondary | ICD-10-CM

## 2016-08-25 DIAGNOSIS — R202 Paresthesia of skin: Secondary | ICD-10-CM | POA: Diagnosis not present

## 2016-08-25 MED ORDER — PREGABALIN 75 MG PO CAPS: 75.0000 mg | ORAL_CAPSULE | Freq: Two times a day (BID) | ORAL | 11 refills | Status: DC

## 2016-08-25 MED ORDER — PREGABALIN 75 MG PO CAPS: 75.0000 mg | ORAL_CAPSULE | Freq: Two times a day (BID) | ORAL | 0 refills | Status: DC

## 2016-08-25 MED ORDER — CYCLOBENZAPRINE HCL 5 MG PO TABS: 5.0000 mg | ORAL_TABLET | Freq: Every evening | ORAL | 3 refills | Status: DC | PRN
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
## 2016-08-25 NOTE — Patient Instructions (Addendum)
1.  Continue Lyrica 75mg  twice daily 2.  Start flexeril 5mg  at bedtime as needed for neck pain 3.  Start neck physiotherapy  4.  If do you not have improvement with physical therapy, call my office and we can schedule MRI.  Return to clinic in 1 year

## 2016-09-13 ENCOUNTER — Other Ambulatory Visit: Payer: Self-pay | Admitting: Family Medicine

## 2016-09-15 ENCOUNTER — Encounter: Payer: Self-pay | Admitting: Family Medicine

## 2016-09-15 ENCOUNTER — Ambulatory Visit (INDEPENDENT_AMBULATORY_CARE_PROVIDER_SITE_OTHER): Payer: 59 | Admitting: Family Medicine

## 2016-09-15 VITALS — BP 119/81 | HR 90 | Temp 98.1°F | Resp 16 | Ht 66.0 in | Wt 229.5 lb

## 2016-09-15 DIAGNOSIS — Z0000 Encounter for general adult medical examination without abnormal findings: Secondary | ICD-10-CM | POA: Diagnosis not present

## 2016-09-15 LAB — BASIC METABOLIC PANEL
BUN: 12 mg/dL (ref 6–23)
CALCIUM: 9.6 mg/dL (ref 8.4–10.5)
CO2: 31 meq/L (ref 19–32)
CREATININE: 0.7 mg/dL (ref 0.40–1.20)
Chloride: 104 mEq/L (ref 96–112)
GFR: 97.75 mL/min (ref >60.00)
GLUCOSE: 77 mg/dL (ref 70–99)
Potassium: 4.4 mEq/L (ref 3.5–5.1)
Sodium: 140 mEq/L (ref 135–145)

## 2016-09-15 LAB — TSH: TSH: 1.37 u[IU]/mL (ref 0.35–4.50)

## 2016-09-15 LAB — CBC WITH DIFFERENTIAL/PLATELET
Basophils Absolute: 0 10*3/uL (ref 0.0–0.1)
Basophils Relative: 0.5 % (ref 0.0–3.0)
EOS ABS: 0 10*3/uL (ref 0.0–0.7)
Eosinophils Relative: 0.7 % (ref 0.0–5.0)
HEMATOCRIT: 38.8 % (ref 36.0–46.0)
HEMOGLOBIN: 13.3 g/dL (ref 12.0–15.0)
LYMPHS PCT: 32.8 % (ref 12.0–46.0)
Lymphs Abs: 1.9 10*3/uL (ref 0.7–4.0)
MCHC: 34.3 g/dL (ref 30.0–36.0)
MCV: 92.4 fl (ref 78.0–100.0)
Monocytes Absolute: 0.5 10*3/uL (ref 0.1–1.0)
Monocytes Relative: 8.2 % (ref 3.0–12.0)
NEUTROS ABS: 3.4 10*3/uL (ref 1.4–7.7)
Neutrophils Relative %: 57.8 % (ref 43.0–77.0)
PLATELETS: 297 10*3/uL (ref 150.0–400.0)
RBC: 4.2 Mil/uL (ref 3.87–5.11)
RDW: 12.4 % (ref 11.5–15.5)
WBC: 5.9 10*3/uL (ref 4.0–10.5)

## 2016-09-15 LAB — VITAMIN D 25 HYDROXY (VIT D DEFICIENCY, FRACTURES): VITD: 20.76 ng/mL — ABNORMAL LOW (ref 30.00–100.00)

## 2016-09-15 LAB — HEPATIC FUNCTION PANEL
ALBUMIN: 4.5 g/dL (ref 3.5–5.2)
ALT: 16 U/L (ref 0–35)
AST: 16 U/L (ref 0–37)
Alkaline Phosphatase: 70 U/L (ref 39–117)
Bilirubin, Direct: 0.1 mg/dL (ref 0.0–0.3)
TOTAL PROTEIN: 7.2 g/dL (ref 6.0–8.3)
Total Bilirubin: 0.3 mg/dL (ref 0.2–1.2)

## 2016-09-15 LAB — LIPID PANEL
CHOLESTEROL: 212 mg/dL — AB (ref 0–200)
HDL: 73.1 mg/dL (ref >39.00)
LDL CALC: 100 mg/dL — AB (ref 0–99)
NonHDL: 138.4
Total CHOL/HDL Ratio: 3
Triglycerides: 192 mg/dL — ABNORMAL HIGH (ref 0.0–149.0)
VLDL: 38.4 mg/dL (ref 0.0–40.0)

## 2016-09-15 NOTE — Progress Notes (Signed)
   Subjective:    Patient ID: [name], female    DOB: Aug 15, 1976, 40 y.o.   MRN: [id number]  HPI CPE- UTD on mammo, no need for pap due to hysterectomy.  UTD on flu shot, Tdap   Review of Systems Patient reports no vision/ hearing changes, adenopathy,fever, weight change,  persistant/recurrent hoarseness , swallowing issues, chest pain, palpitations, edema, persistant/recurrent cough, hemoptysis, dyspnea (rest/exertional/paroxysmal nocturnal), gastrointestinal bleeding (melena, rectal bleeding), abdominal pain, significant heartburn, bowel changes, GU symptoms (dysuria, hematuria, incontinence), Gyn symptoms (abnormal  bleeding, pain),  syncope, focal weakness, memory loss, numbness & tingling, skin/hair/nail changes, abnormal bruising or bleeding, anxiety, or depression.     Objective:   Physical Exam General Appearance:    Alert, cooperative, no distress, appears stated age  Head:    Normocephalic, without obvious abnormality, atraumatic  Eyes:    PERRL, conjunctiva/corneas clear, EOM's intact, fundi    benign, both eyes  Ears:    Normal TM's and external ear canals, both ears  Nose:   Nares normal, septum midline, mucosa normal, no drainage    or sinus tenderness  Throat:   Lips, mucosa, and tongue normal; teeth and gums normal  Neck:   Supple, symmetrical, trachea midline, no adenopathy;    Thyroid: no enlargement/tenderness/nodules  Back:     Symmetric, no curvature, ROM normal, no CVA tenderness  Lungs:     Clear to auscultation bilaterally, respirations unlabored  Chest Wall:    No tenderness or deformity   Heart:    Regular rate and rhythm, S1 and S2 normal, no murmur, rub   or gallop  Breast Exam:    Deferred to mammo  Abdomen:     Soft, non-tender, bowel sounds active all four quadrants,    no masses, no organomegaly  Genitalia:    Deferred  Rectal:    Extremities:   Extremities normal, atraumatic, no cyanosis or edema  Pulses:   2+ and symmetric all extremities    Skin:   Skin color, texture, turgor normal, no rashes or lesions  Lymph nodes:   Cervical, supraclavicular, and axillary nodes normal  Neurologic:   CNII-XII intact, normal strength, sensation and reflexes    throughout          Assessment & Plan:

## 2016-09-15 NOTE — Progress Notes (Signed)
Pre visit review using our clinic review tool, if applicable. No additional management support is needed unless otherwise documented below in the visit note. 

## 2016-09-15 NOTE — Assessment & Plan Note (Signed)
Pt's PE WNL.  UTD on mammo, immunizations.  Check labs.  Anticipatory guidance provided.  

## 2016-09-15 NOTE — Patient Instructions (Signed)
Follow up in 1 year or as needed We'll notify you of your lab results and make any changes if needed Keep up the good work on healthy diet and regular exercise- you're doing great!!! Call with any questions or concerns Happy Holidays!!!

## 2016-09-16 ENCOUNTER — Other Ambulatory Visit: Payer: Self-pay | Admitting: General Practice

## 2016-09-16 MED ORDER — VITAMIN D (ERGOCALCIFEROL) 1.25 MG (50000 UNIT) PO CAPS: 50000.0000 [IU] | ORAL_CAPSULE | ORAL | 0 refills | Status: DC

## 2016-09-19 ENCOUNTER — Other Ambulatory Visit: Payer: Self-pay | Admitting: *Deleted

## 2016-09-19 ENCOUNTER — Encounter: Payer: Self-pay | Admitting: Neurology

## 2016-09-19 MED ORDER — SUMATRIPTAN SUCCINATE 100 MG PO TABS: 100.0000 mg | ORAL_TABLET | Freq: Two times a day (BID) | ORAL | 3 refills | Status: DC | PRN

## 2016-10-19 DIAGNOSIS — M5412 Radiculopathy, cervical region: Secondary | ICD-10-CM | POA: Diagnosis not present

## 2016-10-19 DIAGNOSIS — M542 Cervicalgia: Secondary | ICD-10-CM | POA: Diagnosis not present

## 2016-10-19 DIAGNOSIS — M5413 Radiculopathy, cervicothoracic region: Secondary | ICD-10-CM | POA: Diagnosis not present

## 2016-11-01 ENCOUNTER — Encounter: Payer: Self-pay | Admitting: Neurology

## 2016-11-08 ENCOUNTER — Encounter: Payer: Self-pay | Admitting: Physician Assistant

## 2016-11-08 ENCOUNTER — Ambulatory Visit (INDEPENDENT_AMBULATORY_CARE_PROVIDER_SITE_OTHER): Payer: 59 | Admitting: Physician Assistant

## 2016-11-08 VITALS — BP 110/80 | HR 93 | Temp 98.3°F | Resp 16 | Ht 66.0 in | Wt 238.0 lb

## 2016-11-08 DIAGNOSIS — J4521 Mild intermittent asthma with (acute) exacerbation: Secondary | ICD-10-CM | POA: Diagnosis not present

## 2016-11-08 MED ORDER — PREDNISONE 20 MG PO TABS: 40.0000 mg | ORAL_TABLET | Freq: Every day | ORAL | 0 refills | Status: DC

## 2016-11-08 MED ORDER — ALBUTEROL SULFATE (2.5 MG/3ML) 0.083% IN NEBU: 2.5000 mg | INHALATION_SOLUTION | Freq: Four times a day (QID) | RESPIRATORY_TRACT | 1 refills | Status: AC | PRN

## 2016-11-08 MED ORDER — IPRATROPIUM-ALBUTEROL 0.5-2.5 (3) MG/3ML IN SOLN
3.0000 mL | Freq: Once | RESPIRATORY_TRACT | Status: AC
  Administered 2016-11-08: 3 mL via RESPIRATORY_TRACT

## 2016-11-08 NOTE — Patient Instructions (Signed)
Please stay hydrated. Use Mucinex twice daily to thing out any mucous developing. Take Prednisone as directed.  Use albuterol inhaler or nebulizer as directed once every 6 hours.  Do not use both as directed.   Follow-up if symptoms are not improving within 48 hours.

## 2016-11-08 NOTE — Progress Notes (Signed)
Patient is a former smoker with history of asthma presents to clinic today c/o 1 day of chest tightness with wheezing. Endorses dry cough and mild chest congestion. Denies fever, chills, malaise/fatigue. Denies sinus pressure, sinus pain, drainage. Denies recent travel or sick contact. Notes asthma is mild, intermittent after quitting smoking 2 years ago. Rare use of Albuterol at baseline. Has used multiple times yesterday with some relief of symptoms.    Past Medical History:  Diagnosis Date  . Alcoholism ([place])   . Allergy   . Anemia   . Anxiety   . Arthritis   . Asthma   . Depression   . GERD (gastroesophageal reflux disease)   . Hyperlipidemia   . IBS (irritable bowel syndrome)   . Migraine     Current Outpatient Prescriptions on File Prior to Visit  Medication Sig Dispense Refill  . ALPRAZolam (XANAX) 0.5 MG tablet Take 0.5 mg by mouth daily.   2  . carbamazepine (EQUETRO) 200 MG CP12 Take 600 mg by mouth daily.     . cyclobenzaprine (FLEXERIL) 5 MG tablet Take 1 tablet (5 mg total) by mouth at bedtime as needed (neck pain). 30 tablet 3  . estradiol (ESTRACE) 1 MG tablet Take 1 mg by mouth at bedtime.    Marland Kitchen omeprazole (PRILOSEC) 20 MG capsule TAKE 1 CAPSULE (20 MG TOTAL) BY MOUTH 2 (TWO) TIMES DAILY. 60 capsule 6  . pregabalin (LYRICA) 75 MG capsule Take 1 capsule (75 mg total) by mouth 2 (two) times daily. 60 capsule 11  . SUMAtriptan (IMITREX) 100 MG tablet Take 1 tablet (100 mg total) by mouth 2 (two) times daily as needed for migraine. 10 tablet 3  . Vitamin D, Ergocalciferol, (DRISDOL) 50000 units CAPS capsule Take 1 capsule (50,000 Units total) by mouth every 7 (seven) days. 12 capsule 0   No current facility-administered medications on file prior to visit.     Allergies  Allergen Reactions  . Codeine Nausea Only    derivatives  . Penicillins Rash and Other (See Comments)    "skin turns black"    Family History  Problem Relation Age of Onset  . Depression Mother      Living  . Depression Father     Living  . Breast cancer Paternal Grandmother   . Heart disease Maternal Grandfather   . Depression Brother   . Depression Sister   . Colon cancer Neg Hx   . Esophageal cancer Neg Hx   . Rectal cancer Neg Hx   . Stomach cancer Neg Hx     Social History   Social History  . Marital status: Single    Spouse name: N/A  . Number of children: 0  . Years of education: N/A   Occupational History  . Sales [employer]    Social History Main Topics  . Smoking status: Former Smoker    Packs/day: 0.75    Years: 9.00    Types: Cigarettes    Quit date: 09/18/2014  . Smokeless tobacco: Never Used     Comment: Quit in December 2015.  Marland Kitchen Alcohol use 0.0 oz/week     Comment: in recovery, none  . Drug use: No     Comment: in recovery  . Sexual activity: Not Asked   Other Topics Concern  . None   Social History Narrative   She lives alone.  No children.   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   Highest level of education:  B.S.  Review of Systems - See HPI.  All other ROS are negative.  BP 110/80   Pulse 93   Temp 98.3 F (36.8 C) (Oral)   Resp 16   Ht 5\' 6"  (1.676 m)   Wt 238 lb (108 kg)   SpO2 98%   BMI 38.41 kg/m   Physical Exam  Constitutional: She is oriented to person, place, and time and well-developed, well-nourished, and in no distress.  HENT:  Head: Normocephalic and atraumatic.  Right Ear: External ear normal.  Left Ear: External ear normal.  Nose: Nose normal.  Mouth/Throat: Oropharynx is clear and moist. No oropharyngeal exudate.  TM within normal limits bilaterally  Eyes: Conjunctivae are normal.  Neck: Neck supple.  Cardiovascular: Normal rate, regular rhythm, normal heart sounds and intact distal pulses.   Pulmonary/Chest: No respiratory distress. She has wheezes. She has no rales. She exhibits no tenderness.  Neurological: She is alert and oriented to person, place, and time.  Skin: Skin is warm  and dry. No rash noted.  Psychiatric: Affect normal.  Vitals reviewed.   Recent Results (from the past 2160 hour(s))  Lipid panel     Status: Abnormal   Collection Time: 09/15/16  9:43 AM  Result Value Ref Range   Cholesterol 212 (H) 0 - 200 mg/dL    Comment: ATP III Classification       Desirable:  < 200 mg/dL               Borderline High:  200 - 239 mg/dL          High:  > = 240 mg/dL   Triglycerides 192.0 (H) 0.0 - 149.0 mg/dL    Comment: Normal:  <150 mg/dLBorderline High:  150 - 199 mg/dL   HDL 73.10 >39.00 mg/dL   VLDL 38.4 0.0 - 40.0 mg/dL   LDL Cholesterol 100 (H) 0 - 99 mg/dL   Total CHOL/HDL Ratio 3     Comment:                Men          Women1/2 Average Risk     3.4          3.3Average Risk          5.0          4.42X Average Risk          9.6          7.13X Average Risk          15.0          11.0                       NonHDL 138.40     Comment: NOTE:  Non-HDL goal should be 30 mg/dL higher than patient's LDL goal (i.e. LDL goal of < 70 mg/dL, would have non-HDL goal of < 100 mg/dL)  Basic metabolic panel     Status: None   Collection Time: 09/15/16  9:43 AM  Result Value Ref Range   Sodium 140 135 - 145 mEq/L   Potassium 4.4 3.5 - 5.1 mEq/L   Chloride 104 96 - 112 mEq/L   CO2 31 19 - 32 mEq/L   Glucose, Bld 77 70 - 99 mg/dL   BUN 12 6 - 23 mg/dL   Creatinine, Ser 0.70 0.40 - 1.20 mg/dL   Calcium 9.6 8.4 - 10.5 mg/dL   GFR 97.75 >60.00 mL/min  TSH  Status: None   Collection Time: 09/15/16  9:43 AM  Result Value Ref Range   TSH 1.37 0.35 - 4.50 uIU/mL  Hepatic function panel     Status: None   Collection Time: 09/15/16  9:43 AM  Result Value Ref Range   Total Bilirubin 0.3 0.2 - 1.2 mg/dL   Bilirubin, Direct 0.1 0.0 - 0.3 mg/dL   Alkaline Phosphatase 70 39 - 117 U/L   AST 16 0 - 37 U/L   ALT 16 0 - 35 U/L   Total Protein 7.2 6.0 - 8.3 g/dL   Albumin 4.5 3.5 - 5.2 g/dL  CBC with Differential/Platelet     Status: None   Collection Time: 09/15/16  9:43 AM    Result Value Ref Range   WBC 5.9 4.0 - 10.5 K/uL   RBC 4.20 3.87 - 5.11 Mil/uL   Hemoglobin 13.3 12.0 - 15.0 g/dL   HCT 38.8 36.0 - 46.0 %   MCV 92.4 78.0 - 100.0 fl   MCHC 34.3 30.0 - 36.0 g/dL   RDW 12.4 11.5 - 15.5 %   Platelets 297.0 150.0 - 400.0 K/uL   Neutrophils Relative % 57.8 43.0 - 77.0 %   Lymphocytes Relative 32.8 12.0 - 46.0 %   Monocytes Relative 8.2 3.0 - 12.0 %   Eosinophils Relative 0.7 0.0 - 5.0 %   Basophils Relative 0.5 0.0 - 3.0 %   Neutro Abs 3.4 1.4 - 7.7 K/uL   Lymphs Abs 1.9 0.7 - 4.0 K/uL   Monocytes Absolute 0.5 0.1 - 1.0 K/uL   Eosinophils Absolute 0.0 0.0 - 0.7 K/uL   Basophils Absolute 0.0 0.0 - 0.1 K/uL  VITAMIN D 25 Hydroxy (Vit-D Deficiency, Fractures)     Status: Abnormal   Collection Time: 09/15/16  9:43 AM  Result Value Ref Range   VITD 20.76 (L) 30.00 - 100.00 ng/mL    Assessment/Plan: 1. Mild intermittent asthma with exacerbation Duoneb given with improvement. Alb MDI/neb at home as directed. Rx prednisone burst. Supportive measures and OTC medications reviewed. Strict return precautions discussed with patient.  - predniSONE (DELTASONE) 20 MG tablet; Take 2 tablets (40 mg total) by mouth daily with breakfast.  Dispense: 10 tablet; Refill: 0 - albuterol (PROVENTIL) (2.5 MG/3ML) 0.083% nebulizer solution; Take 3 mLs (2.5 mg total) by nebulization every 6 (six) hours as needed for wheezing or shortness of breath.  Dispense: 150 mL; Refill: 1   Leeanne Rio, [place]

## 2016-11-08 NOTE — Addendum Note (Signed)
Addended by: Leonidas Romberg on: 11/08/2016 10:16 AM   Modules accepted: Orders

## 2016-11-08 NOTE — Progress Notes (Signed)
Pre visit review using our clinic review tool, if applicable. No additional management support is needed unless otherwise documented below in the visit note. 

## 2016-12-12 ENCOUNTER — Other Ambulatory Visit: Payer: Self-pay | Admitting: Family Medicine

## 2016-12-12 DIAGNOSIS — Z1231 Encounter for screening mammogram for malignant neoplasm of breast: Secondary | ICD-10-CM

## 2016-12-14 ENCOUNTER — Other Ambulatory Visit: Payer: Self-pay | Admitting: Family Medicine

## 2016-12-29 DIAGNOSIS — M961 Postlaminectomy syndrome, not elsewhere classified: Secondary | ICD-10-CM | POA: Diagnosis not present

## 2016-12-29 DIAGNOSIS — M542 Cervicalgia: Secondary | ICD-10-CM | POA: Diagnosis not present

## 2016-12-30 ENCOUNTER — Telehealth: Payer: Self-pay | Admitting: General Practice

## 2016-12-30 NOTE — Telephone Encounter (Signed)
Vitamin d denied, pt needs to continue OTC vitamin d 2000iu daily.

## 2017-01-03 ENCOUNTER — Ambulatory Visit
Admission: RE | Admit: 2017-01-03 | Discharge: 2017-01-03 | Disposition: A | Discharge status: Home / Self Care | Payer: 59 | Source: Ambulatory Visit | Attending: Family Medicine | Admitting: Family Medicine

## 2017-01-03 DIAGNOSIS — Z1231 Encounter for screening mammogram for malignant neoplasm of breast: Secondary | ICD-10-CM | POA: Diagnosis not present

## 2017-01-10 ENCOUNTER — Telehealth: Payer: Self-pay | Admitting: General Practice

## 2017-01-10 NOTE — Telephone Encounter (Signed)
Medication denied, pt needs to continue OTC vitamin d 2000iu daily.

## 2017-01-17 DIAGNOSIS — M961 Postlaminectomy syndrome, not elsewhere classified: Secondary | ICD-10-CM | POA: Diagnosis not present

## 2017-01-17 DIAGNOSIS — M542 Cervicalgia: Secondary | ICD-10-CM | POA: Diagnosis not present

## 2017-01-18 DIAGNOSIS — Z01419 Encounter for gynecological examination (general) (routine) without abnormal findings: Secondary | ICD-10-CM | POA: Diagnosis not present

## 2017-01-27 DIAGNOSIS — M545 Low back pain: Secondary | ICD-10-CM | POA: Diagnosis not present

## 2017-01-27 DIAGNOSIS — M961 Postlaminectomy syndrome, not elsewhere classified: Secondary | ICD-10-CM | POA: Diagnosis not present

## 2017-02-01 DIAGNOSIS — M961 Postlaminectomy syndrome, not elsewhere classified: Secondary | ICD-10-CM | POA: Diagnosis not present

## 2017-02-01 DIAGNOSIS — M545 Low back pain: Secondary | ICD-10-CM | POA: Diagnosis not present

## 2017-02-08 DIAGNOSIS — M545 Low back pain: Secondary | ICD-10-CM | POA: Diagnosis not present

## 2017-02-08 DIAGNOSIS — M961 Postlaminectomy syndrome, not elsewhere classified: Secondary | ICD-10-CM | POA: Diagnosis not present

## 2017-02-16 DIAGNOSIS — M545 Low back pain: Secondary | ICD-10-CM | POA: Diagnosis not present

## 2017-02-16 DIAGNOSIS — M961 Postlaminectomy syndrome, not elsewhere classified: Secondary | ICD-10-CM | POA: Diagnosis not present

## 2017-03-09 ENCOUNTER — Other Ambulatory Visit: Payer: Self-pay | Admitting: Neurology

## 2017-03-10 ENCOUNTER — Encounter: Payer: Self-pay | Admitting: Neurology

## 2017-03-10 ENCOUNTER — Other Ambulatory Visit: Payer: Self-pay | Admitting: *Deleted

## 2017-03-10 ENCOUNTER — Other Ambulatory Visit: Payer: Self-pay | Admitting: Neurology

## 2017-03-10 DIAGNOSIS — M545 Low back pain: Secondary | ICD-10-CM | POA: Diagnosis not present

## 2017-03-10 DIAGNOSIS — M961 Postlaminectomy syndrome, not elsewhere classified: Secondary | ICD-10-CM | POA: Diagnosis not present

## 2017-03-10 NOTE — Telephone Encounter (Signed)
Will fax new Rx.

## 2017-03-15 DIAGNOSIS — M545 Low back pain: Secondary | ICD-10-CM | POA: Diagnosis not present

## 2017-03-15 DIAGNOSIS — M961 Postlaminectomy syndrome, not elsewhere classified: Secondary | ICD-10-CM | POA: Diagnosis not present

## 2017-03-23 ENCOUNTER — Encounter: Payer: Self-pay | Admitting: Neurology

## 2017-03-23 DIAGNOSIS — M961 Postlaminectomy syndrome, not elsewhere classified: Secondary | ICD-10-CM | POA: Diagnosis not present

## 2017-03-23 DIAGNOSIS — M545 Low back pain: Secondary | ICD-10-CM | POA: Diagnosis not present

## 2017-03-29 DIAGNOSIS — M961 Postlaminectomy syndrome, not elsewhere classified: Secondary | ICD-10-CM | POA: Diagnosis not present

## 2017-03-31 ENCOUNTER — Ambulatory Visit (INDEPENDENT_AMBULATORY_CARE_PROVIDER_SITE_OTHER): Payer: 59 | Admitting: Family Medicine

## 2017-03-31 ENCOUNTER — Other Ambulatory Visit: Payer: Self-pay | Admitting: General Practice

## 2017-03-31 ENCOUNTER — Encounter: Payer: Self-pay | Admitting: Family Medicine

## 2017-03-31 VITALS — BP 122/81 | HR 77 | Temp 98.0°F | Resp 16 | Ht 66.0 in | Wt 234.4 lb

## 2017-03-31 DIAGNOSIS — R5383 Other fatigue: Secondary | ICD-10-CM | POA: Diagnosis not present

## 2017-03-31 DIAGNOSIS — E559 Vitamin D deficiency, unspecified: Secondary | ICD-10-CM

## 2017-03-31 LAB — CBC WITH DIFFERENTIAL/PLATELET
Basophils Absolute: 0.1 10*3/uL (ref 0.0–0.1)
Basophils Relative: 0.9 % (ref 0.0–3.0)
EOS PCT: 1.1 % (ref 0.0–5.0)
Eosinophils Absolute: 0.1 10*3/uL (ref 0.0–0.7)
HCT: 36.2 % (ref 36.0–46.0)
HEMOGLOBIN: 12.6 g/dL (ref 12.0–15.0)
Lymphocytes Relative: 32.9 % (ref 12.0–46.0)
Lymphs Abs: 2.4 10*3/uL (ref 0.7–4.0)
MCHC: 34.9 g/dL (ref 30.0–36.0)
MCV: 91.5 fl (ref 78.0–100.0)
MONO ABS: 0.5 10*3/uL (ref 0.1–1.0)
Monocytes Relative: 7 % (ref 3.0–12.0)
Neutro Abs: 4.3 10*3/uL (ref 1.4–7.7)
Neutrophils Relative %: 58.1 % (ref 43.0–77.0)
Platelets: 305 10*3/uL (ref 150.0–400.0)
RBC: 3.96 Mil/uL (ref 3.87–5.11)
RDW: 12.3 % (ref 11.5–15.5)
WBC: 7.3 10*3/uL (ref 4.0–10.5)

## 2017-03-31 LAB — HEPATIC FUNCTION PANEL
ALT: 14 U/L (ref 0–35)
AST: 13 U/L (ref 0–37)
Albumin: 4.2 g/dL (ref 3.5–5.2)
Alkaline Phosphatase: 64 U/L (ref 39–117)
BILIRUBIN DIRECT: 0.1 mg/dL (ref 0.0–0.3)
BILIRUBIN TOTAL: 0.3 mg/dL (ref 0.2–1.2)
TOTAL PROTEIN: 6.4 g/dL (ref 6.0–8.3)

## 2017-03-31 LAB — BASIC METABOLIC PANEL
BUN: 15 mg/dL (ref 6–23)
CALCIUM: 9.2 mg/dL (ref 8.4–10.5)
CO2: 28 meq/L (ref 19–32)
CREATININE: 0.68 mg/dL (ref 0.40–1.20)
Chloride: 102 mEq/L (ref 96–112)
GFR: 100.81 mL/min (ref >60.00)
Glucose, Bld: 95 mg/dL (ref 70–99)
Potassium: 4.1 mEq/L (ref 3.5–5.1)
Sodium: 138 mEq/L (ref 135–145)

## 2017-03-31 LAB — TSH: TSH: 1.35 u[IU]/mL (ref 0.35–4.50)

## 2017-03-31 LAB — VITAMIN D 25 HYDROXY (VIT D DEFICIENCY, FRACTURES): VITD: 25.69 ng/mL — AB (ref 30.00–100.00)

## 2017-03-31 MED ORDER — VITAMIN D (ERGOCALCIFEROL) 1.25 MG (50000 UNIT) PO CAPS: 50000.0000 [IU] | ORAL_CAPSULE | ORAL | 0 refills | Status: DC

## 2017-03-31 NOTE — Progress Notes (Signed)
   Subjective:    Patient ID: [name], female    DOB: 1976-07-02, 41 y.o.   MRN: [id number]  HPI Fatigue- 'if I were to guess, it's a dip in Vit D'.  Increased stress recently, HAs.  Increased diarrhea from baseline.  Some dizziness.  'generally out of sorts'.  'fatigue is severe.  I can barely keep my eyes open'.  No fevers.  Increased frequency of palpitations.  No CP, SOB.     Review of Systems For ROS see HPI     Objective:   Physical Exam  Constitutional: She is oriented to person, place, and time. She appears well-developed and well-nourished. No distress.  obese  HENT:  Head: Normocephalic and atraumatic.  Eyes: Conjunctivae and EOM are normal. Pupils are equal, round, and reactive to light.  Neck: Normal range of motion. Neck supple. No thyromegaly present.  Cardiovascular: Normal rate, regular rhythm, normal heart sounds and intact distal pulses.   No murmur heard. Pulmonary/Chest: Effort normal and breath sounds normal. No respiratory distress.  Abdominal: Soft. She exhibits no distension. There is no tenderness.  Musculoskeletal: She exhibits no edema.  Lymphadenopathy:    She has no cervical adenopathy.  Neurological: She is alert and oriented to person, place, and time.  Skin: Skin is warm and dry.  Psychiatric: She has a normal mood and affect. Her behavior is normal.  Vitals reviewed.         Assessment & Plan:

## 2017-03-31 NOTE — Patient Instructions (Signed)
Follow up as needed/scheduled We'll notify you of your lab results and make any changes if needed Continue to work on healthy diet and regular exercise- good for energy levels and stress management Call with any questions or concerns Have a great weekend!!!

## 2017-03-31 NOTE — Assessment & Plan Note (Signed)
Pt has hx of this and reports that she is feeling similar to when she previously had low levels.  Check labs and replete prn.  Pt expressed understanding and is in agreement w/ plan.

## 2017-03-31 NOTE — Progress Notes (Signed)
Pre visit review using our clinic review tool, if applicable. No additional management support is needed unless otherwise documented below in the visit note. 

## 2017-03-31 NOTE — Assessment & Plan Note (Signed)
Deteriorated.  Pt reports sxs are much worse than her baseline and she is just over all not feeling well.  She suspects a vitamin D deficiency but we will get all other labs to assess for metabolic cause of her fatigue and cluster of sxs.  Pt expressed understanding and is in agreement w/ plan.

## 2017-04-01 LAB — CARBAMAZEPINE LEVEL, TOTAL: Carbamazepine Lvl: 8 mg/L (ref 4.0–12.0)

## 2017-04-18 ENCOUNTER — Other Ambulatory Visit: Payer: Self-pay | Admitting: Family Medicine

## 2017-04-28 ENCOUNTER — Encounter: Payer: Self-pay | Admitting: Neurology

## 2017-04-28 ENCOUNTER — Encounter: Payer: Self-pay | Admitting: *Deleted

## 2017-05-03 DIAGNOSIS — R079 Chest pain, unspecified: Secondary | ICD-10-CM | POA: Diagnosis not present

## 2017-05-03 DIAGNOSIS — J029 Acute pharyngitis, unspecified: Secondary | ICD-10-CM | POA: Diagnosis not present

## 2017-05-05 ENCOUNTER — Encounter: Payer: Self-pay | Admitting: Family Medicine

## 2017-05-05 ENCOUNTER — Ambulatory Visit (INDEPENDENT_AMBULATORY_CARE_PROVIDER_SITE_OTHER): Payer: 59 | Admitting: Family Medicine

## 2017-05-05 VITALS — BP 112/80 | HR 80 | Temp 98.1°F | Resp 16 | Ht 66.0 in | Wt 230.1 lb

## 2017-05-05 DIAGNOSIS — J301 Allergic rhinitis due to pollen: Secondary | ICD-10-CM | POA: Diagnosis not present

## 2017-05-05 DIAGNOSIS — J309 Allergic rhinitis, unspecified: Secondary | ICD-10-CM | POA: Insufficient documentation

## 2017-05-05 DIAGNOSIS — R0789 Other chest pain: Secondary | ICD-10-CM

## 2017-05-05 DIAGNOSIS — M25561 Pain in right knee: Secondary | ICD-10-CM | POA: Diagnosis not present

## 2017-05-05 MED ORDER — PANTOPRAZOLE SODIUM 40 MG PO TBEC: 40.0000 mg | DELAYED_RELEASE_TABLET | Freq: Every day | ORAL | 3 refills | Status: DC

## 2017-05-05 MED ORDER — GI COCKTAIL ~~LOC~~
30.0000 mL | Freq: Once | ORAL | Status: AC
  Administered 2017-05-05: 30 mL via ORAL

## 2017-05-05 NOTE — Progress Notes (Signed)
Pre visit review using our clinic review tool, if applicable. No additional management support is needed unless otherwise documented below in the visit note. 

## 2017-05-05 NOTE — Patient Instructions (Signed)
Follow up as needed/scheduled STOP the Omeprazole START the Protonix daily to decrease acid production Use OTC Maalox as needed Drink plenty of fluids Call with any questions or concerns Hang in there!!!

## 2017-05-05 NOTE — Progress Notes (Signed)
   Subjective:    Patient ID: [name], female    DOB: April 26, 1976, 41 y.o.   MRN: [id number]  HPI Chest tightness-  + SOB, fatigue.  sxs started 5 days ago.  Went to [facility] 2 days ago and was told she was having MI- refused to go to ER.  Hx of asthma- started using nebulizer.  'i feel like i'm drowning'.    Sinus pressure/nasal congestion- denies ear pain, cough.  No tooth pain.  No fevers.     Review of Systems For ROS see HPI     Objective:   Physical Exam  Constitutional: She is oriented to person, place, and time. She appears well-developed and well-nourished. No distress.  HENT:  Head: Normocephalic and atraumatic.  Right Ear: Tympanic membrane normal.  Left Ear: Tympanic membrane normal.  Nose: Mucosal edema and rhinorrhea present. Right sinus exhibits no maxillary sinus tenderness and no frontal sinus tenderness. Left sinus exhibits no maxillary sinus tenderness and no frontal sinus tenderness.  Mouth/Throat: Mucous membranes are normal. Posterior oropharyngeal erythema (w/ PND) present.  Eyes: Pupils are equal, round, and reactive to light. Conjunctivae and EOM are normal.  Neck: Normal range of motion. Neck supple.  Cardiovascular: Normal rate, regular rhythm and normal heart sounds.   Pulmonary/Chest: Effort normal and breath sounds normal. No respiratory distress. She has no wheezes. She has no rales. She exhibits no tenderness.  Abdominal: Soft. Bowel sounds are normal. She exhibits no distension. There is no tenderness. There is no rebound and no guarding.  Lymphadenopathy:    She has no cervical adenopathy.  Neurological: She is alert and oriented to person, place, and time.  Psychiatric: She has a normal mood and affect. Her behavior is normal. Thought content normal.  Vitals reviewed.         Assessment & Plan:  Chest tightness- new.  Reviewed EKG done at [place].  Vitals WNL.  Gave pt GI cocktail and sxs completely resolved.  She was so  grateful she was in tears.  Stop Omeprazole, start Protonix.  OTC Maalox PRN.  Reviewed supportive care and red flags that should prompt return.  Pt expressed understanding and is in agreement w/ plan.   Allergic rhinitis- no evidence of infxn.  Start daily OTC antihistamine and nasal steroid.  Reviewed supportive care and red flags that should prompt return.  Pt expressed understanding and is in agreement w/ plan.

## 2017-06-22 ENCOUNTER — Other Ambulatory Visit: Payer: Self-pay | Admitting: Family Medicine

## 2017-06-23 ENCOUNTER — Telehealth: Payer: Self-pay

## 2017-06-23 DIAGNOSIS — R002 Palpitations: Secondary | ICD-10-CM | POA: Diagnosis not present

## 2017-06-23 NOTE — Telephone Encounter (Signed)
Spoke with pt regarding symptoms (scheduled mychart visit). Patient reports feeling foggy headed, fatigued, difficulty with names and recalling moments x 2 weeks. Yesterday, she experienced palpitations and lightheadedness, feeling like she may pass out. Advised patient to go to an Urgent Care for evaluation of symptoms. Patient verbalized understanding, requested to keep f/u appt with PCP also.

## 2017-06-23 NOTE — Telephone Encounter (Signed)
Agree she needs urgent evaluation to make sure there is nothing serious taking place. Thank you for reaching out to patient.

## 2017-06-26 ENCOUNTER — Encounter: Payer: Self-pay | Admitting: Family Medicine

## 2017-06-26 ENCOUNTER — Ambulatory Visit (INDEPENDENT_AMBULATORY_CARE_PROVIDER_SITE_OTHER): Payer: 59 | Admitting: Family Medicine

## 2017-06-26 VITALS — BP 138/82 | HR 90 | Temp 98.1°F | Resp 16 | Ht 66.0 in | Wt 235.0 lb

## 2017-06-26 DIAGNOSIS — J301 Allergic rhinitis due to pollen: Secondary | ICD-10-CM | POA: Diagnosis not present

## 2017-06-26 DIAGNOSIS — K219 Gastro-esophageal reflux disease without esophagitis: Secondary | ICD-10-CM | POA: Diagnosis not present

## 2017-06-26 DIAGNOSIS — B349 Viral infection, unspecified: Secondary | ICD-10-CM

## 2017-06-26 MED ORDER — PREDNISONE 10 MG PO TABS: ORAL_TABLET | ORAL | 0 refills | Status: DC

## 2017-06-26 MED ORDER — MONTELUKAST SODIUM 10 MG PO TABS: 10.0000 mg | ORAL_TABLET | Freq: Every day | ORAL | 3 refills | Status: DC

## 2017-06-26 MED ORDER — GI COCKTAIL ~~LOC~~
30.0000 mL | Freq: Once | ORAL | Status: AC
  Administered 2017-06-26: 30 mL via ORAL

## 2017-06-26 NOTE — Patient Instructions (Addendum)
Follow up as needed or as scheduled Continue the Zyrtec and Flonase daily ADD the Singulair nightly Start the Prednisone as directed- take w/ food Continue the Prontonix daily for reflux You also have some sort of viral illness (as evidenced by your mouth sores) Call with any questions or concerns- particularly if things don't improve Hang in there!!!

## 2017-06-26 NOTE — Assessment & Plan Note (Signed)
Deteriorated.  Pt is having to take Mylanta in addition to PPI.  Suspect this is due to her copious PND.  Will be more aggressive w/ allergy tx to improve PND.  If no improvement, will refer to GI.  Pt expressed understanding and is in agreement w/ plan.

## 2017-06-26 NOTE — Progress Notes (Signed)
   Subjective:    Patient ID: [name], female    DOB: 11/01/1975, 41 y.o.   MRN: [id number]  HPI UC f/u- pt reports she 'started feeling bad a couple of weeks ago'.  + allergy congestion, fatigue.  For the last week 'felt real bad'.  Developed increased palpitations.  + mouth sores.  Again having epigastric tightness and increased reflux.  Drinking Mylanta in addition to her PPI.  + facial pain/pressure.  Taking OTC antihistamine, sudafed, tylenol sinus.   Review of Systems For ROS see HPI     Objective:   Physical Exam  Constitutional: She is oriented to person, place, and time. She appears well-developed and well-nourished. No distress.  HENT:  Head: Normocephalic and atraumatic.  Right Ear: Tympanic membrane normal.  Left Ear: Tympanic membrane normal.  Nose: Mucosal edema and rhinorrhea present. Right sinus exhibits no maxillary sinus tenderness and no frontal sinus tenderness. Left sinus exhibits no maxillary sinus tenderness and no frontal sinus tenderness.  Mouth/Throat: Mucous membranes are normal. Posterior oropharyngeal erythema (w/ PND) present.  Multiple sores on palate  Eyes: Pupils are equal, round, and reactive to light. Conjunctivae and EOM are normal.  Neck: Normal range of motion. Neck supple.  Cardiovascular: Normal rate, regular rhythm and normal heart sounds.   Pulmonary/Chest: Effort normal and breath sounds normal. No respiratory distress. She has no wheezes. She has no rales.  Lymphadenopathy:    She has no cervical adenopathy.  Neurological: She is alert and oriented to person, place, and time.  Skin: Skin is warm and dry.  Psychiatric:  Very anxious  Vitals reviewed.         Assessment & Plan:  Viral illness- new.  This may explain pt's fatigue.  No rash on hands or feet to indicate HFM.  Reviewed supportive care and red flags that should prompt return.  Pt expressed understanding and is in agreement w/ plan.

## 2017-06-26 NOTE — Assessment & Plan Note (Signed)
Deteriorated.  No evidence of sinus infxn.  No abx needed.  Start pred taper.  Continue Flonase, antihistamine.  Add Singulair.  Reviewed supportive care and red flags that should prompt return.  Pt expressed understanding and is in agreement w/ plan.

## 2017-06-26 NOTE — Progress Notes (Signed)
Pre visit review using our clinic review tool, if applicable. No additional management support is needed unless otherwise documented below in the visit note. 

## 2017-06-28 ENCOUNTER — Encounter: Payer: Self-pay | Admitting: *Deleted

## 2017-06-28 ENCOUNTER — Encounter: Payer: Self-pay | Admitting: Family Medicine

## 2017-06-28 MED ORDER — SUCRALFATE 1 G PO TABS: 1.0000 g | ORAL_TABLET | Freq: Three times a day (TID) | ORAL | 0 refills | Status: DC

## 2017-06-29 ENCOUNTER — Encounter: Payer: Self-pay | Admitting: Internal Medicine

## 2017-06-29 ENCOUNTER — Ambulatory Visit (INDEPENDENT_AMBULATORY_CARE_PROVIDER_SITE_OTHER): Payer: 59 | Admitting: Internal Medicine

## 2017-06-29 VITALS — BP 126/64 | HR 70 | Ht 66.0 in | Wt 235.0 lb

## 2017-06-29 DIAGNOSIS — K219 Gastro-esophageal reflux disease without esophagitis: Secondary | ICD-10-CM | POA: Diagnosis not present

## 2017-06-29 DIAGNOSIS — R1013 Epigastric pain: Secondary | ICD-10-CM | POA: Diagnosis not present

## 2017-06-29 DIAGNOSIS — K580 Irritable bowel syndrome with diarrhea: Secondary | ICD-10-CM | POA: Diagnosis not present

## 2017-06-29 DIAGNOSIS — R1310 Dysphagia, unspecified: Secondary | ICD-10-CM

## 2017-06-29 MED ORDER — SUCRALFATE 1 GM/10ML PO SUSP: 1.0000 g | Freq: Three times a day (TID) | ORAL | 0 refills | Status: DC

## 2017-06-29 MED ORDER — PANTOPRAZOLE SODIUM 40 MG PO TBEC: 40.0000 mg | DELAYED_RELEASE_TABLET | Freq: Two times a day (BID) | ORAL | 3 refills | Status: DC

## 2017-06-29 NOTE — Progress Notes (Signed)
Patient ID: [name], female   DOB: 15-Jul-1976, 41 y.o.   MRN: [id number] HPI: [name] is a 41 year old female previously seen by me last in September 2014 for irritable bowel with diarrhea who is seen in consult at the request of Dr. Birdie Riddle to evaluate GERD and esophageal chest pain. She has a history of GERD, bipolar disorder, migraine headaches, rheumatoid arthritis previously on methotrexate, Humira and Xeljanz (but off these now). At the time of her last office visit she was taking Lotronex and had improvement in her diarrhea. She has been off of this medication for many years and never followed up after her last visit. She states that the dramatic improvement in her diarrhea was almost harder for her because it was not what she was use to. Off the medication she is having 3-4 loose stools a day. Can have up to 6 stools per day. No blood in her stool or melena. The symptoms are very tolerable for her at present.  Recently she reports that she feels like her "gut is on fire". She has epigastric burning pain which radiates to her chest and the back of her throat. This creates a chest tightness but doesn't feel like asthma. She took Nexium for many years initially 40 mg daily then 40 mg twice daily. 1-2 months ago Dr. Birdie Riddle change this to pantoprazole 40 mg daily. Yesterday a telephone call she was told to use this twice a day before meals. She was also prescribed Carafate but has not started this yet. She reports about a month ago she had an episode of chest pain which she felt was a heart attack. This later improved dramatically with a GI cocktail. She tried Mylanta without any benefit but recent use of Gaviscon has helped. She has noticed on and off  ulcerations in the back of her throat. These have occurred throughout the years for many years.  Past Medical History:  Diagnosis Date  . Alcoholism ([place])   . Allergy   . Anemia   . Anxiety   . Arthritis   . Asthma   . Depression   .  External hemorrhoids   . GERD (gastroesophageal reflux disease)   . Hyperlipidemia   . IBS (irritable bowel syndrome)   . Migraine     Past Surgical History:  Procedure Laterality Date  . BREAST BIOPSY Left 12/28/2015  . CERVICAL FUSION    . CHOLECYSTECTOMY    . inguinal hernai repair    . KNEE SURGERY    . LAPAROSCOPIC HYSTERECTOMY    . TONSILLECTOMY      Outpatient Medications Prior to Visit  Medication Sig Dispense Refill  . albuterol (PROVENTIL) (2.5 MG/3ML) 0.083% nebulizer solution Take 3 mLs (2.5 mg total) by nebulization every 6 (six) hours as needed for wheezing or shortness of breath. 150 mL 1  . albuterol (VENTOLIN HFA) 108 (90 Base) MCG/ACT inhaler Inhale 1-2 puffs into the lungs every 6 (six) hours as needed for wheezing or shortness of breath.    . ALPRAZolam (XANAX) 0.5 MG tablet Take 0.5 mg by mouth daily.   2  . EQUETRO 300 MG CP12 Take 2 capsules by mouth at bedtime.   5  . estradiol (ESTRACE) 1 MG tablet Take 1 mg by mouth at bedtime.    Marland Kitchen LYRICA 75 MG capsule TAKE ONE CAPSULE BY MOUTH TWICE A DAY 60 capsule 5  . montelukast (SINGULAIR) 10 MG tablet Take 1 tablet (10 mg total) by mouth at bedtime. 30 tablet 3  .  SUMAtriptan (IMITREX) 100 MG tablet Take 1 tablet (100 mg total) by mouth 2 (two) times daily as needed for migraine. 10 tablet 3  . pantoprazole (PROTONIX) 40 MG tablet Take 1 tablet (40 mg total) by mouth daily. (Patient taking differently: Take 40 mg by mouth 2 (two) times daily. ) 30 tablet 3  . sucralfate (CARAFATE) 1 g tablet Take 1 tablet (1 g total) by mouth 4 (four) times daily -  with meals and at bedtime. 120 tablet 0  . predniSONE (DELTASONE) 10 MG tablet 3 tabs x3 days and then 2 tabs x3 days and then 1 tab x3 days.  Take w/ food. 18 tablet 0   No facility-administered medications prior to visit.     Allergies  Allergen Reactions  . Codeine Nausea Only    derivatives  . Penicillins Rash and Other (See Comments)    "skin turns black"     Family History  Problem Relation Age of Onset  . Depression Mother        Living  . Depression Father        Living  . Breast cancer Paternal Grandmother   . Heart disease Maternal Grandfather   . Depression Brother   . Depression Sister   . Colon cancer Neg Hx   . Esophageal cancer Neg Hx   . Rectal cancer Neg Hx   . Stomach cancer Neg Hx     Social History  Substance Use Topics  . Smoking status: Former Smoker    Packs/day: 0.75    Years: 9.00    Types: Cigarettes    Quit date: 09/18/2014  . Smokeless tobacco: Never Used     Comment: Quit in December 2015.  Marland Kitchen Alcohol use 0.0 oz/week     Comment: in recovery, none    ROS: As per history of present illness, otherwise negative  BP 126/64   Pulse 70   Ht 5\' 6"  (1.676 m)   Wt 235 lb (106.6 kg)   BMI 37.93 kg/m  Constitutional: Well-developed and well-nourished. No distress. HEENT: Normocephalic and atraumatic. Oropharynx moist, 2 superficial aphthous ulcerations right and left palatine tonsil. Conjunctivae are normal.  No scleral icterus. Neck: Neck supple. Trachea midline. Cardiovascular: Normal rate, regular rhythm and intact distal pulses. Pulmonary/chest: Effort normal and breath sounds normal. No wheezing, rales or rhonchi. Abdominal: Soft, nontender, nondistended. Bowel sounds active throughout.  Extremities: no clubbing, cyanosis, or edema Neurological: Alert and oriented to person place and time. Skin: Skin is warm and dry.  Psychiatric: Normal mood and affect. Behavior is normal.  RELEVANT LABS AND IMAGING: CBC    Component Value Date/Time   WBC 7.3 03/31/2017 1157   RBC 3.96 03/31/2017 1157   HGB 12.6 03/31/2017 1157   HCT 36.2 03/31/2017 1157   PLT 305.0 03/31/2017 1157   MCV 91.5 03/31/2017 1157   MCH 32.0 09/19/2014 1839   MCHC 34.9 03/31/2017 1157   RDW 12.3 03/31/2017 1157   LYMPHSABS 2.4 03/31/2017 1157   MONOABS 0.5 03/31/2017 1157   EOSABS 0.1 03/31/2017 1157   BASOSABS 0.1  03/31/2017 1157    CMP     Component Value Date/Time   NA 138 03/31/2017 1157   K 4.1 03/31/2017 1157   CL 102 03/31/2017 1157   CO2 28 03/31/2017 1157   GLUCOSE 95 03/31/2017 1157   BUN 15 03/31/2017 1157   CREATININE 0.68 03/31/2017 1157   CALCIUM 9.2 03/31/2017 1157   PROT 6.4 03/31/2017 1157   ALBUMIN 4.2 03/31/2017  1157   AST 13 03/31/2017 1157   ALT 14 03/31/2017 1157   ALKPHOS 64 03/31/2017 1157   BILITOT 0.3 03/31/2017 1157   GFRNONAA >90 09/19/2014 1839   GFRAA >90 09/19/2014 1839    ASSESSMENT/PLAN: 41 year old female previously seen by me last in September 2014 for irritable bowel with diarrhea who is seen in consult at the request of Dr. Birdie Riddle to evaluate GERD and esophageal chest pain.   1. GERD/odynophagia with atypical chest pain/epigastric discomfort  -- likely uncontrolled GERD, rule out reflux esophagitis, much more unlikely rule out viral esophagitis. Agree with increasing pantoprazole to 40 mg twice a day before meals and adding Carafate suspension 1 g 3 times a day before meals and at bedtime. We discussed GERD diet and avoiding trigger foods. Also recommended upper endoscopy to evaluate and exclude esophagitis, gastritis, H. pylori and ulcer disease. She will stop prednisone taper as this can exacerbate gastritis. She only had one day left of pred.  2. IBS-D -- chronic, not currently bothering patient and so we will not re-institute medical therapy at this time    EH:[id number], Aundra Millet, Md [address] [place], [place] [address]

## 2017-06-29 NOTE — Patient Instructions (Signed)
You have been scheduled for an endoscopy. Please follow written instructions given to you at your visit today. If you use inhalers (even only as needed), please bring them with you on the day of your procedure. Your physician has requested that you go to www.startemmi.com and enter the access code given to you at your visit today. This web site gives a general overview about your procedure. However, you should still follow specific instructions given to you by our office regarding your preparation for the procedure.  We have sent the following medications to your pharmacy for you to pick up at your convenience: Pantoprazole 40 mg twice daily before meal carafate suspension-before meals and at bedtime  Please discontinue the prednisone taper.  If you are age 59 or older, your body mass index should be between 23-30. Your Body mass index is 37.93 kg/m. If this is out of the aforementioned range listed, please consider follow up with your Primary Care Provider.  If you are age 49 or younger, your body mass index should be between 19-25. Your Body mass index is 37.93 kg/m. If this is out of the aformentioned range listed, please consider follow up with your Primary Care Provider.

## 2017-07-03 ENCOUNTER — Encounter: Payer: Self-pay | Admitting: Internal Medicine

## 2017-07-03 ENCOUNTER — Other Ambulatory Visit: Payer: Self-pay

## 2017-07-03 DIAGNOSIS — R112 Nausea with vomiting, unspecified: Secondary | ICD-10-CM

## 2017-07-03 DIAGNOSIS — R109 Unspecified abdominal pain: Secondary | ICD-10-CM

## 2017-07-03 MED ORDER — ONDANSETRON 4 MG PO TBDP: ORAL_TABLET | ORAL | 0 refills | Status: DC

## 2017-07-03 NOTE — Telephone Encounter (Signed)
Would recommend: CBC, CMP, lipase, GI pathogen panel, C diff PCR sent stat US abd complete Zofran ODT 4-8 mg every 6-8 hrs PRN nausea/vomiting (max dose 24 mcg in 24 hrs)

## 2017-07-05 ENCOUNTER — Other Ambulatory Visit: Payer: 59

## 2017-07-05 ENCOUNTER — Other Ambulatory Visit (INDEPENDENT_AMBULATORY_CARE_PROVIDER_SITE_OTHER): Payer: 59

## 2017-07-05 DIAGNOSIS — R109 Unspecified abdominal pain: Secondary | ICD-10-CM

## 2017-07-05 LAB — COMPREHENSIVE METABOLIC PANEL
ALBUMIN: 3.9 g/dL (ref 3.5–5.2)
ALK PHOS: 60 U/L (ref 39–117)
ALT: 17 U/L (ref 0–35)
AST: 17 U/L (ref 0–37)
BILIRUBIN TOTAL: 0.3 mg/dL (ref 0.2–1.2)
BUN: 10 mg/dL (ref 6–23)
CO2: 29 mEq/L (ref 19–32)
CREATININE: 0.71 mg/dL (ref 0.40–1.20)
Calcium: 9.1 mg/dL (ref 8.4–10.5)
Chloride: 103 mEq/L (ref 96–112)
GFR: 95.79 mL/min (ref >60.00)
Glucose, Bld: 109 mg/dL — ABNORMAL HIGH (ref 70–99)
POTASSIUM: 4.6 meq/L (ref 3.5–5.1)
SODIUM: 139 meq/L (ref 135–145)
Total Protein: 6.4 g/dL (ref 6.0–8.3)

## 2017-07-05 LAB — CBC WITH DIFFERENTIAL/PLATELET
BASOS PCT: 0.9 % (ref 0.0–3.0)
Basophils Absolute: 0.1 10*3/uL (ref 0.0–0.1)
EOS PCT: 1.3 % (ref 0.0–5.0)
Eosinophils Absolute: 0.1 10*3/uL (ref 0.0–0.7)
HCT: 37.1 % (ref 36.0–46.0)
HEMOGLOBIN: 12.9 g/dL (ref 12.0–15.0)
Lymphocytes Relative: 37.9 % (ref 12.0–46.0)
Lymphs Abs: 2.5 10*3/uL (ref 0.7–4.0)
MCHC: 34.7 g/dL (ref 30.0–36.0)
MCV: 93.8 fl (ref 78.0–100.0)
MONO ABS: 0.5 10*3/uL (ref 0.1–1.0)
Monocytes Relative: 7.7 % (ref 3.0–12.0)
Neutro Abs: 3.4 10*3/uL (ref 1.4–7.7)
Neutrophils Relative %: 52.2 % (ref 43.0–77.0)
PLATELETS: 297 10*3/uL (ref 150.0–400.0)
RBC: 3.95 Mil/uL (ref 3.87–5.11)
RDW: 12 % (ref 11.5–15.5)
WBC: 6.6 10*3/uL (ref 4.0–10.5)

## 2017-07-05 LAB — LIPASE: LIPASE: 28 U/L (ref 11.0–59.0)

## 2017-07-05 NOTE — Addendum Note (Signed)
Addended by: Sallye Ober on: 07/05/2017 07:48 AM   Modules accepted: Orders

## 2017-07-06 ENCOUNTER — Ambulatory Visit (HOSPITAL_COMMUNITY)
Admission: RE | Admit: 2017-07-06 | Discharge: 2017-07-06 | Disposition: A | Discharge status: Home / Self Care | Payer: 59 | Source: Ambulatory Visit | Attending: Internal Medicine | Admitting: Internal Medicine

## 2017-07-06 DIAGNOSIS — R109 Unspecified abdominal pain: Secondary | ICD-10-CM

## 2017-07-06 LAB — CLOSTRIDIUM DIFFICILE BY PCR: CDIFFPCR: NOT DETECTED

## 2017-07-10 LAB — GASTROINTESTINAL PATHOGEN PANEL PCR
C. difficile Tox A/B, PCR: NOT DETECTED
CRYPTOSPORIDIUM, PCR: NOT DETECTED
Campylobacter, PCR: NOT DETECTED
E COLI (STEC) STX1/STX2, PCR: NOT DETECTED
E coli (ETEC) LT/ST PCR: NOT DETECTED
E coli 0157, PCR: NOT DETECTED
GIARDIA LAMBLIA, PCR: NOT DETECTED
Norovirus, PCR: NOT DETECTED
ROTAVIRUS, PCR: NOT DETECTED
Salmonella, PCR: NOT DETECTED
Shigella, PCR: NOT DETECTED

## 2017-07-11 ENCOUNTER — Encounter: Payer: Self-pay | Admitting: Internal Medicine

## 2017-07-11 ENCOUNTER — Ambulatory Visit (AMBULATORY_SURGERY_CENTER): Payer: 59 | Admitting: Internal Medicine

## 2017-07-11 VITALS — BP 128/67 | HR 83 | Temp 98.0°F | Resp 16 | Ht 66.0 in | Wt 235.0 lb

## 2017-07-11 DIAGNOSIS — R1013 Epigastric pain: Secondary | ICD-10-CM | POA: Diagnosis not present

## 2017-07-11 DIAGNOSIS — R1319 Other dysphagia: Secondary | ICD-10-CM | POA: Diagnosis not present

## 2017-07-11 DIAGNOSIS — K2990 Gastroduodenitis, unspecified, without bleeding: Secondary | ICD-10-CM

## 2017-07-11 DIAGNOSIS — K2950 Unspecified chronic gastritis without bleeding: Secondary | ICD-10-CM | POA: Diagnosis not present

## 2017-07-11 DIAGNOSIS — K2970 Gastritis, unspecified, without bleeding: Secondary | ICD-10-CM | POA: Diagnosis not present

## 2017-07-11 DIAGNOSIS — K219 Gastro-esophageal reflux disease without esophagitis: Secondary | ICD-10-CM | POA: Diagnosis present

## 2017-07-11 MED ORDER — SODIUM CHLORIDE 0.9 % IV SOLN: 500.0000 mL | INTRAVENOUS | Status: DC

## 2017-07-11 NOTE — Progress Notes (Signed)
Called to room to assist during endoscopic procedure.  Patient ID and intended procedure confirmed with present staff. Received instructions for my participation in the procedure from the performing physician.  

## 2017-07-11 NOTE — Progress Notes (Signed)
Report given to PACU, vss 

## 2017-07-11 NOTE — Patient Instructions (Signed)
  Handouts given: GERD and Gastritis.  Continue present medications including: Pantoprazole 40 mg twice daily and Sulcralfate.  Return to Clinic if symptoms worsen.    YOU HAD AN ENDOSCOPIC PROCEDURE TODAY AT [facility]:   Refer to the procedure report that was given to you for any specific questions about what was found during the examination.  If the procedure report does not answer your questions, please call your gastroenterologist to clarify.  If you requested that your care partner not be given the details of your procedure findings, then the procedure report has been included in a sealed envelope for you to review at your convenience later.  YOU SHOULD EXPECT: Some feelings of bloating in the abdomen. Passage of more gas than usual.  Walking can help get rid of the air that was put into your GI tract during the procedure and reduce the bloating. If you had a lower endoscopy (such as a colonoscopy or flexible sigmoidoscopy) you may notice spotting of blood in your stool or on the toilet paper. If you underwent a bowel prep for your procedure, you may not have a normal bowel movement for a few days.  Please Note:  You might notice some irritation and congestion in your nose or some drainage.  This is from the oxygen used during your procedure.  There is no need for concern and it should clear up in a day or so.  SYMPTOMS TO REPORT IMMEDIATELY:    Following upper endoscopy (EGD)  Vomiting of blood or coffee ground material  New chest pain or pain under the shoulder blades  Painful or persistently difficult swallowing  New shortness of breath  Fever of 100F or higher  Black, tarry-looking stools  For urgent or emergent issues, a gastroenterologist can be reached at any hour by calling [phone].   DIET:  We do recommend a small meal at first, but then you may proceed to your regular diet.  Drink plenty of fluids but you should avoid alcoholic beverages for 24  hours.  ACTIVITY:  You should plan to take it easy for the rest of today and you should NOT DRIVE or use heavy machinery until tomorrow (because of the sedation medicines used during the test).    FOLLOW UP: Our staff will call the number listed on your records the next business day following your procedure to check on you and address any questions or concerns that you may have regarding the information given to you following your procedure. If we do not reach you, we will leave a message.  However, if you are feeling well and you are not experiencing any problems, there is no need to return our call.  We will assume that you have returned to your regular daily activities without incident.  If any biopsies were taken you will be contacted by phone or by letter within the next 1-3 weeks.  Please call us at [phone] if you have not heard about the biopsies in 3 weeks.    SIGNATURES/CONFIDENTIALITY: You and/or your care partner have signed paperwork which will be entered into your electronic medical record.  These signatures attest to the fact that that the information above on your After Visit Summary has been reviewed and is understood.  Full responsibility of the confidentiality of this discharge information lies with you and/or your care-partner.

## 2017-07-11 NOTE — Op Note (Signed)
[name] Procedure Date: 07/11/2017 9:04 AM MRN: [id number] Endoscopist: Jerene Bears , MD Age: 41 Referring MD:  Date of Birth: 07-09-1976 Gender: Female Account #: [id number] Procedure:                Upper GI endoscopy Indications:              Epigastric abdominal pain, Odynophagia,                            Gastro-esophageal reflux disease, Unexplained chest                            pain Medicines:                Monitored Anesthesia Care Procedure:                Pre-Anesthesia Assessment:                           - Prior to the procedure, a History and Physical                            was performed, and patient medications and                            allergies were reviewed. The patient's tolerance of                            previous anesthesia was also reviewed. The risks                            and benefits of the procedure and the sedation                            options and risks were discussed with the patient.                            All questions were answered, and informed consent                            was obtained. Prior Anticoagulants: The patient has                            taken no previous anticoagulant or antiplatelet                            agents. ASA Grade Assessment: II - A patient with                            mild systemic disease. After reviewing the risks                            and benefits, the patient was deemed in  satisfactory condition to undergo the procedure.                           After obtaining informed consent, the endoscope was                            passed under direct vision. Throughout the                            procedure, the patient's blood pressure, pulse, and                            oxygen saturations were monitored continuously. The                            Endoscope was introduced through the mouth, and                     advanced to the second part of duodenum. The upper                            GI endoscopy was accomplished without difficulty.                            The patient tolerated the procedure well. Scope In: Scope Out: Findings:                 The examined esophagus was normal. Z-line is                            regular at 38 cm from the incisors. No evidence of                            esophagitis.                           Moderate, striped, inflammation characterized by                            erythema and granularity was found in the gastric                            body, incisura and in the gastric antrum/prepyloric                            stomach. Biopsies were taken with a cold forceps                            for histology and Helicobacter pylori testing                            (body, antrum and incisura).                           The cardia and gastric fundus were normal on  retroflexion.                           The examined duodenum was normal. Complications:            No immediate complications. Estimated Blood Loss:     Estimated blood loss was minimal. Impression:               - Normal esophagus.                           - Gastritis. Biopsied.                           - Normal examined duodenum. Recommendation:           - Patient has a contact number available for                            emergencies. The signs and symptoms of potential                            delayed complications were discussed with the                            patient. Return to normal activities tomorrow.                            Written discharge instructions were provided to the                            patient.                           - Resume previous diet. Avoid acidic and spicy                            foods. Smaller more frequent meals and avoiding                            over-eating will likely improve  symptoms.                           - Continue present medications, including                            pantoprazole 40 mg twice daily before meals and                            sucralfate suspension 1 g before meals and at                            bedtime.                           - Await pathology results.                           -  Recent labs and abdominal US unremarkable.                           - Return to clinic if symptoms persist or worsen                            prior to follow-up. Jerene Bears, MD 07/11/2017 9:23:11 AM This report has been signed electronically.

## 2017-07-12 ENCOUNTER — Telehealth: Payer: Self-pay | Admitting: *Deleted

## 2017-07-12 NOTE — Telephone Encounter (Signed)
  Follow up Call-  Call back number 07/11/2017  Post procedure Call Back phone  # [phone]  Permission to leave phone message Yes  Some recent data might be hidden     Patient questions:  Do you have a fever, pain , or abdominal swelling? No. Pain Score  0 *  Have you tolerated food without any problems? Yes.    Have you been able to return to your normal activities? Yes.    Do you have any questions about your discharge instructions: Diet   No. Medications  No. Follow up visit  No.  Do you have questions or concerns about your Care? No.  Actions: * If pain score is 4 or above: No action needed, pain <4.

## 2017-07-18 DIAGNOSIS — Z23 Encounter for immunization: Secondary | ICD-10-CM | POA: Diagnosis not present

## 2017-07-19 ENCOUNTER — Other Ambulatory Visit: Payer: Self-pay | Admitting: Neurology

## 2017-07-30 ENCOUNTER — Encounter: Payer: Self-pay | Admitting: Internal Medicine

## 2017-07-31 NOTE — Telephone Encounter (Signed)
Okay to refill the liquid Carafate 1 g 3 times a day before meals and at bedtime when necessary Can reduce pantoprazole to 40 mg once daily Office follow-up in 2-3 months, she should call back if worsening before that

## 2017-08-01 ENCOUNTER — Other Ambulatory Visit: Payer: Self-pay | Admitting: Internal Medicine

## 2017-08-04 ENCOUNTER — Other Ambulatory Visit: Payer: Self-pay

## 2017-08-04 MED ORDER — SUCRALFATE 1 GM/10ML PO SUSP: 1.0000 g | Freq: Three times a day (TID) | ORAL | 0 refills | Status: DC
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
## 2017-08-28 ENCOUNTER — Encounter: Payer: Self-pay | Admitting: Neurology

## 2017-08-28 ENCOUNTER — Ambulatory Visit: Payer: 59 | Admitting: Neurology

## 2017-08-28 VITALS — BP 120/80 | HR 94 | Ht 66.0 in | Wt 230.5 lb

## 2017-08-28 DIAGNOSIS — G43009 Migraine without aura, not intractable, without status migrainosus: Secondary | ICD-10-CM | POA: Diagnosis not present

## 2017-08-28 MED ORDER — PREGABALIN 75 MG PO CAPS: 75.0000 mg | ORAL_CAPSULE | Freq: Two times a day (BID) | ORAL | 5 refills | Status: DC

## 2017-08-28 NOTE — Patient Instructions (Signed)
Return to clinic in 1 year.

## 2017-08-29 ENCOUNTER — Other Ambulatory Visit: Payer: Self-pay

## 2017-08-29 MED ORDER — PANTOPRAZOLE SODIUM 40 MG PO TBEC: 40.0000 mg | DELAYED_RELEASE_TABLET | Freq: Every day | ORAL | 0 refills | Status: DC

## 2017-09-20 ENCOUNTER — Other Ambulatory Visit: Payer: Self-pay | Admitting: General Practice

## 2017-09-20 ENCOUNTER — Encounter: Payer: 59 | Admitting: Family Medicine

## 2017-09-20 MED ORDER — MONTELUKAST SODIUM 10 MG PO TABS: 10.0000 mg | ORAL_TABLET | Freq: Every day | ORAL | 0 refills | Status: DC

## 2017-10-13 ENCOUNTER — Encounter: Payer: Self-pay | Admitting: Family Medicine

## 2017-10-13 ENCOUNTER — Ambulatory Visit (INDEPENDENT_AMBULATORY_CARE_PROVIDER_SITE_OTHER): Payer: 59 | Admitting: Family Medicine

## 2017-10-13 ENCOUNTER — Other Ambulatory Visit: Payer: Self-pay

## 2017-10-13 VITALS — BP 134/84 | HR 69 | Temp 98.3°F | Resp 17 | Ht 66.0 in | Wt 242.0 lb

## 2017-10-13 DIAGNOSIS — L02412 Cutaneous abscess of left axilla: Secondary | ICD-10-CM

## 2017-10-13 MED ORDER — DOXYCYCLINE HYCLATE 100 MG PO TABS: 100.0000 mg | ORAL_TABLET | Freq: Two times a day (BID) | ORAL | 0 refills | Status: DC

## 2017-10-13 NOTE — Progress Notes (Signed)
   Subjective:    Patient ID: [name], female    DOB: July 19, 1976, 42 y.o.   MRN: [id number]  HPI Boil- L axilla.  Lump has been present for 'a long time' but just recently started draining green pus and oozing.  Now pain is radiating into R breast.  No fevers.     Review of Systems For ROS see HPI     Objective:   Physical Exam  Constitutional: She is oriented to person, place, and time. She appears well-developed and well-nourished. No distress.  HENT:  Head: Normocephalic and atraumatic.  Neurological: She is alert and oriented to person, place, and time.  Skin: Skin is warm and dry. No erythema.  Pt w/ 1 inch diameter abscess in L axilla that oozes pus from multiple pores/sites w/ steady pressure  Psychiatric: She has a normal mood and affect. Her behavior is normal. Thought content normal.  Vitals reviewed.         Assessment & Plan:  Axillary abscess- new.  Pt's abscess may actually be hidradenitis suppuritiva seeing as how pus was draining from multiple pores.  Wound culture collected and sent.  Start empiric Doxy.  Refer to [facility].  Reviewed supportive care and red flags that should prompt return.  Pt expressed understanding and is in agreement w/ plan.

## 2017-10-13 NOTE — Patient Instructions (Signed)
Follow up as needed or as scheduled Start the Doxycycline twice daily- take w/ food Hot compresses will help the area come to a head and drain Ibuprofen as needed for pain Call with any questions or concerns Happy New Year!!!

## 2017-10-16 LAB — WOUND CULTURE
MICRO NUMBER:: 90015340
RESULT:: NO GROWTH
SPECIMEN QUALITY:: ADEQUATE

## 2017-10-23 DIAGNOSIS — L723 Sebaceous cyst: Secondary | ICD-10-CM | POA: Diagnosis not present

## 2017-10-31 ENCOUNTER — Other Ambulatory Visit: Payer: Self-pay | Admitting: General Surgery

## 2017-10-31 ENCOUNTER — Encounter: Payer: Self-pay | Admitting: *Deleted

## 2017-10-31 DIAGNOSIS — L929 Granulomatous disorder of the skin and subcutaneous tissue, unspecified: Secondary | ICD-10-CM | POA: Diagnosis not present

## 2017-10-31 DIAGNOSIS — L723 Sebaceous cyst: Secondary | ICD-10-CM | POA: Diagnosis not present

## 2017-11-13 ENCOUNTER — Ambulatory Visit: Payer: 59 | Admitting: Internal Medicine

## 2017-11-13 ENCOUNTER — Encounter: Payer: Self-pay | Admitting: Internal Medicine

## 2017-11-13 VITALS — BP 122/84 | HR 76 | Ht 66.0 in | Wt 226.1 lb

## 2017-11-13 DIAGNOSIS — K648 Other hemorrhoids: Secondary | ICD-10-CM

## 2017-11-13 DIAGNOSIS — K219 Gastro-esophageal reflux disease without esophagitis: Secondary | ICD-10-CM

## 2017-11-13 DIAGNOSIS — K580 Irritable bowel syndrome with diarrhea: Secondary | ICD-10-CM

## 2017-11-13 MED ORDER — PANTOPRAZOLE SODIUM 40 MG PO TBEC: 40.0000 mg | DELAYED_RELEASE_TABLET | Freq: Every day | ORAL | 1 refills | Status: DC

## 2017-11-13 MED ORDER — HYDROCORTISONE ACETATE 25 MG RE SUPP: 25.0000 mg | Freq: Two times a day (BID) | RECTAL | 0 refills | Status: DC

## 2017-11-13 NOTE — Patient Instructions (Signed)
We have sent the following medications to your pharmacy for you to pick up at your convenience: Hydrocortisone suppositories-twice daily x 5-7 days pantoprazole  Please purchase the following medications over the counter and take as directed: Recticare as needed for rectal pain  Creamy Desitin twice daily externally

## 2017-11-13 NOTE — Progress Notes (Signed)
Subjective:    Patient ID: [name], female    DOB: 10-Sep-1976, 42 y.o.   MRN: [id number]  HPI [name] is a 42 year old female with a history of IBS with diarrhea predominance, GERD and gastritis who is here for follow-up.  She also has a history of bipolar disorder, migraine headaches and rheumatoid arthritis.  She was last seen at the time of upper endoscopy on 10-18.  I saw her in the clinic in September 2018 when she was having reflux, atypical chest pain and epigastric pain.  She had an EGD on 10-18 which showed a normal esophagus, striped gastritis and a normal duodenum.  Path was unremarkable.  She was treated with twice daily PPI and liquid Carafate.  An abdominal ultrasound was negative.  She reports that slowly her upper GI symptoms improved.  She is no longer having issues with reflux or burning upper abdominal pain.  She has continued pantoprazole 40 mg once daily but has been able to stop and remain off Carafate.  She is also changed her diet and avoiding carbonated foods but also spicy foods.  More recently she has had issues with hemorrhoids which have become quite bothersome for her.  She has lifelong history of loose stools with average being 2-3 loose stools per day.  With the Carafate she found her stools to be slightly more infrequent and at times more formed.  This was harder for her to pass at times.  She developed perianal irritation, itching, scant blood with wiping and prolapse symptoms.  She has tried Preparation H cream without much benefit.  When she tried to apply with the applicator it seemed to make the prolapse tissue worse.   Review of Systems As per HPI, otherwise negative  Current Medications, Allergies, Past Medical History, Past Surgical History, Family History and Social History were reviewed in [employer] record.     Objective:   Physical Exam BP 122/84   Pulse 76   Ht 5\' 6"  (1.676 m)   Wt 226 lb 2 oz (102.6 kg)    BMI 36.50 kg/m  Constitutional: Well-developed and well-nourished. No distress. HEENT: Normocephalic and atraumatic. Conjunctivae are normal.  No scleral icterus. Neck: Neck supple. Trachea midline. Cardiovascular: Normal rate, regular rhythm and intact distal pulses. No M/R/G Pulmonary/chest: Effort normal and breath sounds normal. No wheezing, rales or rhonchi. Abdominal: Soft, nontender, nondistended. Bowel sounds active throughout.  Rectal: No external lesions, or tenderness, no masses, soft brown heme-negative stool, normal tone. Extremities: no clubbing, cyanosis, or edema Neurological: Alert and oriented to person place and time. Skin: Skin is warm and dry. Psychiatric: Normal mood and affect. Behavior is normal.      Assessment & Plan:  42 year old female with a history of IBS with diarrhea predominance, GERD and gastritis who is here for follow-up.    1.  Symptomatic internal hemorrhoids --prolapse, scant bleeding, perianal itching and irritation.  Begin hydrocortisone suppository 25 mg nightly times 5-7 days.  Can use twice daily if necessary.  RectiCare per box instruction for perianal discomfort.  Creamy Desitin twice daily for barrier protection and as an antifungal. --If symptoms persist consider hemorrhoidal banding  2.  IBS-D --stable, though bowel movements changed with Carafate.  Have gone back to her baseline now that she is off Carafate.  We had previously discussed therapy for this but this is not bothering her and she wishes to not pursue additional therapy at present.  3.  GERD/gastritis --improved after twice daily PPI  and Carafate.  Back to once daily PPI with stable symptoms.  She will call if persistent hemorrhoidal symptoms, if so consider banding 25 minutes spent with the patient today. Greater than 50% was spent in counseling and coordination of care with the patient

## 2017-11-17 DIAGNOSIS — M25561 Pain in right knee: Secondary | ICD-10-CM | POA: Diagnosis not present

## 2017-11-17 DIAGNOSIS — M2410 Other articular cartilage disorders, unspecified site: Secondary | ICD-10-CM | POA: Diagnosis not present

## 2017-11-23 DIAGNOSIS — M5136 Other intervertebral disc degeneration, lumbar region: Secondary | ICD-10-CM | POA: Diagnosis not present

## 2017-11-23 DIAGNOSIS — M5033 Other cervical disc degeneration, cervicothoracic region: Secondary | ICD-10-CM | POA: Diagnosis not present

## 2017-12-01 ENCOUNTER — Other Ambulatory Visit: Payer: Self-pay | Admitting: Family Medicine

## 2017-12-01 DIAGNOSIS — Z139 Encounter for screening, unspecified: Secondary | ICD-10-CM

## 2017-12-08 ENCOUNTER — Encounter: Payer: Self-pay | Admitting: Family Medicine

## 2017-12-08 ENCOUNTER — Ambulatory Visit (INDEPENDENT_AMBULATORY_CARE_PROVIDER_SITE_OTHER): Payer: 59 | Admitting: Family Medicine

## 2017-12-08 ENCOUNTER — Other Ambulatory Visit: Payer: Self-pay

## 2017-12-08 VITALS — BP 124/80 | HR 67 | Temp 98.0°F | Resp 16 | Ht 66.0 in | Wt 221.0 lb

## 2017-12-08 DIAGNOSIS — Z0000 Encounter for general adult medical examination without abnormal findings: Secondary | ICD-10-CM | POA: Diagnosis not present

## 2017-12-08 DIAGNOSIS — E559 Vitamin D deficiency, unspecified: Secondary | ICD-10-CM | POA: Diagnosis not present

## 2017-12-08 DIAGNOSIS — F319 Bipolar disorder, unspecified: Secondary | ICD-10-CM | POA: Diagnosis not present

## 2017-12-08 DIAGNOSIS — E669 Obesity, unspecified: Secondary | ICD-10-CM

## 2017-12-08 LAB — CBC WITH DIFFERENTIAL/PLATELET
BASOS ABS: 0.1 10*3/uL (ref 0.0–0.1)
Basophils Relative: 0.9 % (ref 0.0–3.0)
EOS ABS: 0 10*3/uL (ref 0.0–0.7)
Eosinophils Relative: 0.6 % (ref 0.0–5.0)
HEMATOCRIT: 35.2 % — AB (ref 36.0–46.0)
HEMOGLOBIN: 12.3 g/dL (ref 12.0–15.0)
LYMPHS PCT: 30.5 % (ref 12.0–46.0)
Lymphs Abs: 2.1 10*3/uL (ref 0.7–4.0)
MCHC: 34.9 g/dL (ref 30.0–36.0)
MCV: 92.7 fl (ref 78.0–100.0)
Monocytes Absolute: 0.4 10*3/uL (ref 0.1–1.0)
Monocytes Relative: 6 % (ref 3.0–12.0)
NEUTROS ABS: 4.3 10*3/uL (ref 1.4–7.7)
Neutrophils Relative %: 62 % (ref 43.0–77.0)
PLATELETS: 274 10*3/uL (ref 150.0–400.0)
RBC: 3.79 Mil/uL — ABNORMAL LOW (ref 3.87–5.11)
RDW: 12.3 % (ref 11.5–15.5)
WBC: 6.9 10*3/uL (ref 4.0–10.5)

## 2017-12-08 LAB — HEPATIC FUNCTION PANEL
ALBUMIN: 3.8 g/dL (ref 3.5–5.2)
ALT: 12 U/L (ref 0–35)
AST: 11 U/L (ref 0–37)
Alkaline Phosphatase: 56 U/L (ref 39–117)
Bilirubin, Direct: 0 mg/dL (ref 0.0–0.3)
TOTAL PROTEIN: 6.7 g/dL (ref 6.0–8.3)
Total Bilirubin: 0.2 mg/dL (ref 0.2–1.2)

## 2017-12-08 LAB — VITAMIN D 25 HYDROXY (VIT D DEFICIENCY, FRACTURES): VITD: 18.96 ng/mL — ABNORMAL LOW (ref 30.00–100.00)

## 2017-12-08 LAB — BASIC METABOLIC PANEL
BUN: 11 mg/dL (ref 6–23)
CALCIUM: 9.3 mg/dL (ref 8.4–10.5)
CO2: 30 mEq/L (ref 19–32)
CREATININE: 0.65 mg/dL (ref 0.40–1.20)
Chloride: 101 mEq/L (ref 96–112)
GFR: 105.85 mL/min (ref >60.00)
GLUCOSE: 101 mg/dL — AB (ref 70–99)
Potassium: 3.6 mEq/L (ref 3.5–5.1)
Sodium: 138 mEq/L (ref 135–145)

## 2017-12-08 LAB — TSH: TSH: 1.26 u[IU]/mL (ref 0.35–4.50)

## 2017-12-08 LAB — LIPID PANEL
CHOLESTEROL: 188 mg/dL (ref 0–200)
HDL: 73.3 mg/dL (ref >39.00)
LDL CALC: 92 mg/dL (ref 0–99)
NonHDL: 114.71
TRIGLYCERIDES: 116 mg/dL (ref 0.0–149.0)
Total CHOL/HDL Ratio: 3
VLDL: 23.2 mg/dL (ref 0.0–40.0)

## 2017-12-08 NOTE — Assessment & Plan Note (Signed)
Pt's PE WNL w/ exception of obesity.  UTD on mammo, immunizations, colonoscopy.  Check labs.  Anticipatory guidance provided.

## 2017-12-08 NOTE — Patient Instructions (Signed)
Follow up in 1 year or as needed We'll notify you of your lab results and make any changes if needed Keep up the good work on healthy diet and regular exercise- you look great!!! Call with any questions or concerns Have a great weekend!!!

## 2017-12-08 NOTE — Assessment & Plan Note (Signed)
Pt has hx of this.  Check labs.  Replete prn. 

## 2017-12-08 NOTE — Assessment & Plan Note (Signed)
Following w/ [facility] but is interested in switching providers due to inconsistency at [facility]

## 2017-12-08 NOTE — Assessment & Plan Note (Signed)
Ongoing issue but pt is working hard on this.  Is down 5 lbs since 11/13/17.  Applauded her efforts.  Check labs to risk stratify.  Will follow.

## 2017-12-08 NOTE — Progress Notes (Signed)
   Subjective:    Patient ID: [name], female    DOB: 15-Aug-1976, 42 y.o.   MRN: [id number]  HPI CPE- UTD on mammo, UTD on flu and Tdap.  UTD on colonoscopy.   Review of Systems Patient reports no vision/ hearing changes, adenopathy,fever, weight change,  persistant/recurrent hoarseness , swallowing issues, chest pain, palpitations, edema, persistant/recurrent cough, hemoptysis, dyspnea (rest/exertional/paroxysmal nocturnal), gastrointestinal bleeding (melena, rectal bleeding), abdominal pain, significant heartburn, bowel changes, GU symptoms (dysuria, hematuria, incontinence), Gyn symptoms (abnormal  bleeding, pain),  syncope, focal weakness, memory loss, numbness & tingling, skin/hair/nail changes, abnormal bruising or bleeding, anxiety, or depression.     Objective:   Physical Exam General Appearance:    Alert, cooperative, no distress, appears stated age  Head:    Normocephalic, without obvious abnormality, atraumatic  Eyes:    PERRL, conjunctiva/corneas clear, EOM's intact, fundi    benign, both eyes  Ears:    Normal TM's and external ear canals, both ears  Nose:   Nares normal, septum midline, mucosa normal, no drainage    or sinus tenderness  Throat:   Lips, mucosa, and tongue normal; teeth and gums normal  Neck:   Supple, symmetrical, trachea midline, no adenopathy;    Thyroid: no enlargement/tenderness/nodules  Back:     Symmetric, no curvature, ROM normal, no CVA tenderness  Lungs:     Clear to auscultation bilaterally, respirations unlabored  Chest Wall:    No tenderness or deformity   Heart:    Regular rate and rhythm, S1 and S2 normal, no murmur, rub   or gallop  Breast Exam:    Deferred to mammo  Abdomen:     Soft, non-tender, bowel sounds active all four quadrants,    no masses, no organomegaly  Genitalia:    Deferred to GYN  Rectal:    Extremities:   Extremities normal, atraumatic, no cyanosis or edema  Pulses:   2+ and symmetric all extremities  Skin:    Skin color, texture, turgor normal, no rashes or lesions  Lymph nodes:   Cervical, supraclavicular, and axillary nodes normal  Neurologic:   CNII-XII intact, normal strength, sensation and reflexes    throughout          Assessment & Plan:

## 2017-12-11 ENCOUNTER — Other Ambulatory Visit: Payer: Self-pay | Admitting: General Practice

## 2017-12-11 MED ORDER — VITAMIN D (ERGOCALCIFEROL) 1.25 MG (50000 UNIT) PO CAPS: 50000.0000 [IU] | ORAL_CAPSULE | ORAL | 0 refills | Status: DC

## 2017-12-12 ENCOUNTER — Encounter: Payer: Self-pay | Admitting: Internal Medicine

## 2017-12-12 NOTE — Telephone Encounter (Signed)
I believe the patient is referring to another flare of her hemorrhoids. These were my recommendations at last visit " hemorrhoids with prolapse, scant bleeding, perianal itching and irritation.  Begin hydrocortisone suppository 25 mg nightly times 5-7 days.  Can use twice daily if necessary.  RectiCare per box instruction for perianal discomfort.  Creamy Desitin twice daily for barrier protection and as an antifungal."  Ensure that she is doing the above. Please try to determine if she has a thrombosed external hemorrhoid (moderate to severe pain, external bulge -- if so then she would need surgical referral). If same as before I would add sitz bath rec and then office visit with me for possible banding

## 2017-12-25 ENCOUNTER — Encounter: Payer: Self-pay | Admitting: Family Medicine

## 2017-12-29 ENCOUNTER — Encounter: Payer: Self-pay | Admitting: Family Medicine

## 2017-12-29 ENCOUNTER — Ambulatory Visit: Payer: 59 | Admitting: Family Medicine

## 2017-12-29 VITALS — BP 122/76 | HR 63 | Temp 97.7°F | Resp 18 | Ht 66.0 in | Wt 221.0 lb

## 2017-12-29 DIAGNOSIS — R5383 Other fatigue: Secondary | ICD-10-CM

## 2017-12-29 DIAGNOSIS — M549 Dorsalgia, unspecified: Secondary | ICD-10-CM

## 2017-12-29 LAB — POC URINALSYSI DIPSTICK (AUTOMATED)
Bilirubin, UA: NEGATIVE
Glucose, UA: NEGATIVE
KETONES UA: NEGATIVE
Leukocytes, UA: NEGATIVE
Nitrite, UA: NEGATIVE
PH UA: 6 (ref 5.0–8.0)
PROTEIN UA: NEGATIVE
SPEC GRAV UA: 1.02 (ref 1.010–1.025)
Urobilinogen, UA: 0.2 E.U./dL

## 2017-12-29 MED ORDER — MELOXICAM 15 MG PO TABS: 15.0000 mg | ORAL_TABLET | Freq: Every day | ORAL | 0 refills | Status: DC

## 2017-12-29 MED ORDER — CYCLOBENZAPRINE HCL 10 MG PO TABS: 10.0000 mg | ORAL_TABLET | Freq: Three times a day (TID) | ORAL | 0 refills | Status: DC | PRN

## 2017-12-29 NOTE — Patient Instructions (Signed)
Follow up as needed or as scheduled We'll notify you of your lab results and make any changes if needed START the once daily Meloxicam (take w/ food) for back pain/inflammation USE the cyclobenzaprine as needed for muscle spasm- may cause drowsiness HEAT! Continue to exercises as you are able- this will help your energy level Call with any questions or concerns Hang in there!!

## 2017-12-29 NOTE — Progress Notes (Signed)
   Subjective:    Patient ID: [name], female    DOB: 1976-08-12, 42 y.o.   MRN: [id number]  HPI Fatigue- pt reports sxs for the last 7-10 days.  Yesterday was feeling 'really light headed'.  No CP, SOB.  Not sleeping well but this is not new for her.  sxs are 'interfering dramatically w/ daily life'.  No change in medications.  No abnormal bleeding.  Pt does report increased anxiety.  Labs were all normal on 3/1.  Pt had to pull over 3 times on way home from [place] due to sleepiness.  R low back pain- pt has been boxing recently.  Described as an 'ache.  It's pretty consistent'.  + urinary frequency.  No dysuria.  + urgency.  No sense of incomplete emptying.   Review of Systems For ROS see HPI     Objective:   Physical Exam  Constitutional: She is oriented to person, place, and time. She appears well-developed and well-nourished. No distress.  HENT:  Head: Normocephalic and atraumatic.  Neck: Normal range of motion. Neck supple. No thyromegaly present.  Cardiovascular: Normal rate, regular rhythm and normal heart sounds.  Pulmonary/Chest: Effort normal and breath sounds normal. No respiratory distress. She has no wheezes. She has no rales.  Musculoskeletal: She exhibits tenderness (TTP over R lumbar paraspinal muscle, no CVA tenderness). She exhibits no edema.  Lymphadenopathy:    She has no cervical adenopathy.  Neurological: She is alert and oriented to person, place, and time.  Skin: Skin is warm and dry. No rash noted.  Psychiatric: She has a normal mood and affect. Her behavior is normal. Thought content normal.  Vitals reviewed.         Assessment & Plan:  Back pain- doesn't appear to be urinary related but will send for culture and tx prn.  Suspect this is due to her recent boxing.  Start Flexeril and Mobic prn.  Reviewed supportive care and red flags that should prompt return.  Pt expressed understanding and is in agreement w/ plan.

## 2017-12-30 LAB — CBC WITH DIFFERENTIAL/PLATELET
BASOS PCT: 0.7 %
Basophils Absolute: 47 cells/uL (ref 0–200)
Eosinophils Absolute: 40 cells/uL (ref 15–500)
Eosinophils Relative: 0.6 %
HCT: 35.4 % (ref 35.0–45.0)
Hemoglobin: 12.7 g/dL (ref 11.7–15.5)
Lymphs Abs: 2211 cells/uL (ref 850–3900)
MCH: 32.3 pg (ref 27.0–33.0)
MCHC: 35.9 g/dL (ref 32.0–36.0)
MCV: 90.1 fL (ref 80.0–100.0)
MONOS PCT: 9.3 %
MPV: 10 fL (ref 7.5–12.5)
NEUTROS PCT: 56.4 %
Neutro Abs: 3779 cells/uL (ref 1500–7800)
PLATELETS: 312 10*3/uL (ref 140–400)
RBC: 3.93 10*6/uL (ref 3.80–5.10)
RDW: 11.7 % (ref 11.0–15.0)
TOTAL LYMPHOCYTE: 33 %
WBC: 6.7 10*3/uL (ref 3.8–10.8)
WBCMIX: 623 {cells}/uL (ref 200–950)

## 2017-12-30 LAB — URINE CULTURE
MICRO NUMBER:: 90363336
Result:: NO GROWTH
SPECIMEN QUALITY: ADEQUATE

## 2017-12-30 LAB — TSH: TSH: 1.58 m[IU]/L

## 2017-12-30 LAB — B12 AND FOLATE PANEL
Folate: 21.4 ng/mL
Vitamin B-12: 803 pg/mL (ref 200–1100)

## 2017-12-31 NOTE — Assessment & Plan Note (Signed)
New.  Pt just had CPE and was feeling well and all labs were normal w/ exception of low Vit D.  We are repleting this.  She notes increased anxiety recently and is not sure if her increased fatigue is related.  She reports fatigue is severe and is requiring her to pull over while driving.  Repeat CBC, TSH, and get B12 and Folate.  Tx prn.  Encouraged pt to follow w/ psych regarding increased anxiety.  Pt expressed understanding and is in agreement w/ plan.

## 2018-01-01 ENCOUNTER — Other Ambulatory Visit: Payer: Self-pay | Admitting: Family Medicine

## 2018-01-01 DIAGNOSIS — J302 Other seasonal allergic rhinitis: Secondary | ICD-10-CM

## 2018-01-03 ENCOUNTER — Other Ambulatory Visit: Payer: Self-pay | Admitting: Family Medicine

## 2018-01-04 ENCOUNTER — Ambulatory Visit: Payer: 59

## 2018-01-05 ENCOUNTER — Ambulatory Visit: Payer: 59

## 2018-01-15 DIAGNOSIS — M170 Bilateral primary osteoarthritis of knee: Secondary | ICD-10-CM | POA: Diagnosis not present

## 2018-01-23 ENCOUNTER — Ambulatory Visit
Admission: RE | Admit: 2018-01-23 | Discharge: 2018-01-23 | Disposition: A | Discharge status: Home / Self Care | Payer: 59 | Source: Ambulatory Visit | Attending: Family Medicine | Admitting: Family Medicine

## 2018-01-23 DIAGNOSIS — Z139 Encounter for screening, unspecified: Secondary | ICD-10-CM

## 2018-01-23 DIAGNOSIS — Z1231 Encounter for screening mammogram for malignant neoplasm of breast: Secondary | ICD-10-CM | POA: Diagnosis not present

## 2018-01-30 ENCOUNTER — Other Ambulatory Visit: Payer: Self-pay | Admitting: Family Medicine

## 2018-02-07 DIAGNOSIS — M1711 Unilateral primary osteoarthritis, right knee: Secondary | ICD-10-CM | POA: Diagnosis not present

## 2018-02-07 DIAGNOSIS — Z01419 Encounter for gynecological examination (general) (routine) without abnormal findings: Secondary | ICD-10-CM | POA: Diagnosis not present

## 2018-02-08 ENCOUNTER — Ambulatory Visit: Payer: 59 | Admitting: Allergy & Immunology

## 2018-02-08 ENCOUNTER — Encounter: Payer: Self-pay | Admitting: Allergy & Immunology

## 2018-02-08 VITALS — BP 118/72 | HR 88 | Temp 97.9°F | Resp 16 | Ht 65.0 in | Wt 216.0 lb

## 2018-02-08 DIAGNOSIS — L299 Pruritus, unspecified: Secondary | ICD-10-CM

## 2018-02-08 DIAGNOSIS — J3089 Other allergic rhinitis: Secondary | ICD-10-CM | POA: Diagnosis not present

## 2018-02-08 DIAGNOSIS — J302 Other seasonal allergic rhinitis: Secondary | ICD-10-CM | POA: Insufficient documentation

## 2018-02-08 DIAGNOSIS — J4540 Moderate persistent asthma, uncomplicated: Secondary | ICD-10-CM | POA: Diagnosis not present

## 2018-02-08 MED ORDER — ALBUTEROL SULFATE HFA 108 (90 BASE) MCG/ACT IN AERS: 1.0000 | INHALATION_SPRAY | Freq: Four times a day (QID) | RESPIRATORY_TRACT | 1 refills | Status: DC | PRN

## 2018-02-08 MED ORDER — AZELASTINE HCL 0.1 % NA SOLN: NASAL | 5 refills | Status: DC

## 2018-02-08 MED ORDER — BUDESONIDE-FORMOTEROL FUMARATE 160-4.5 MCG/ACT IN AERO: 2.0000 | INHALATION_SPRAY | Freq: Two times a day (BID) | RESPIRATORY_TRACT | 5 refills | Status: AC

## 2018-02-08 NOTE — Progress Notes (Signed)
NEW PATIENT  Date of Service/Encounter:  02/08/18  Referring provider: Midge Minium, MD   Assessment:   Moderate persistent asthma, uncomplicated  Seasonal and perennial allergic rhinitis (trees, weeds, indoor molds, outdoor molds and dust mites)  Itching - with negative testing to the most common foods   Asthma Reportables:  Severity: moderate persistent  Risk: high Control: not well controlled   [name] is a 42 year old female with a history of bipolar 1 disorder as well as a prolonged history of atopic disease presenting for an allergy and asthma evaluation.  Her asthma diagnosis dates back to her childhood, but seems to have become worse over the last few years. Aside from albuterol, she is on no regular asthma inhalers.  She does have fairly frequent symptoms including nighttime symptoms of coughing.  Therefore, we will start her on a high-dose combined inhaled steroid with long-acting beta agonist to see if this provides some coverage forher symptoms.  Testing today revealed multiple indoor and outdoor sensitizations.  We will maximize her treatment with a continuation of Flonase with the addition of Astelin instead of the Claritin that she is currently on, we will add on Allegra.  Samples were provided today.  She also has a history of itching, with rare occurrences of urticaria.  These are most likely manifestations of her uncontrolled environmental allergens.  We did not do an urticarial work-up today, will consider this in the future.  Plan/Recommendations:   1. Moderate persistent asthma, uncomplicated - Lung testing looked good today.  - Since you are having such shortness of breath problems, I think we should start a combined inhaled steroid and long-acting albuterol. - Spacer sample and demonstration provided. - Daily controller medication(s): Symbicort 160/4.40mcg two puffs twice daily with spacer - Prior to physical activity: ProAir 2 puffs 10-15 minutes  before physical activity. - Rescue medications: ProAir 4 puffs every 4-6 hours as needed - Asthma control goals:  * Full participation in all desired activities (may need albuterol before activity) * Albuterol use two time or less a week on average (not counting use with activity) * Cough interfering with sleep two time or less a month * Oral steroids no more than once a year * No hospitalizations  2. Seasonal and perennial allergic rhinitis - Testing today showed: trees, weeds, indoor molds, outdoor molds and dust mites - Avoidance measures provided. - Stop taking: Zyrtec (but you can go back to it if you do not think the Allegra is doing anything more) - Continue with: Flonase (fluticasone) one spray per nostril twice daily - Start taking: Allegra (fexofenadine) 180mg  table once daily and Astelin (azelastine) 2 sprays per nostril 1-2 times daily as needed - You can use an extra dose of the antihistamine, if needed, for breakthrough symptoms.  - Consider nasal saline rinses 1-2 times daily to remove allergens from the nasal cavities as well as help with mucous clearance (this is especially helpful to do before the nasal sprays are given) - Consider allergy shots as a means of long-term control. - Allergy shots "re-train" and "reset" the immune system to ignore environmental allergens and decrease the resulting immune response to those allergens (sneezing, itchy watery eyes, runny nose, nasal congestion, etc).    - Allergy shots improve symptoms in 75-85% of patients.  - We can discuss more at the next appointment if the medications are not working for you.  3. Itching - Hopefully the Allegra will help with the itching. - You can take an  extra dose of the antihistamine as needed for particularly bad days. - It does not sound that foods are involved, and this was confirmed with the negative testing to the most common foods (peanut, sesame, cashew, soy, fish mix, shellfish mix, wheat, milk,  casein, egg)  4. Return in about 3 months (around 05/11/2018).  Subjective:   [name] is a 42 y.o. female presenting today for evaluation of  Chief Complaint  Patient presents with  . Allergic Rhinitis   . Urticaria  . Fatigue  . Shortness of Breath    [name] has a history of the following: Patient Active Problem List   Diagnosis Date Noted  . Moderate persistent asthma, uncomplicated 79/11/4095  . Seasonal and perennial allergic rhinitis 02/08/2018  . Obesity (BMI 30-39.9) 12/08/2017  . Allergic rhinitis 05/05/2017  . Easy bruising 03/15/2016  . Fatigue 03/11/2015  . Vitamin D deficiency 03/11/2015  . Abnormal neurological exam 09/19/2014  . Other malaise and fatigue 03/12/2014  . LBP (low back pain) 03/12/2014  . Hyperlipidemia 11/08/2013  . Routine general medical examination at a health care facility 08/06/2013  . Sore on scalp 06/19/2013  . Quit smoking 06/19/2013  . Right shoulder pain 04/17/2013  . IBS (irritable bowel syndrome) 03/29/2013  . Bipolar I disorder ([place]) 02/26/2013  . GERD (gastroesophageal reflux disease) 02/26/2013  . Chronic diarrhea 02/26/2013  . Chronic migraine 02/26/2013    History obtained from: chart review and patient.  [name] was referred by Midge Minium, MD.     [name] is a 42 y.o. female presenting for an evalation of asthma and allergies as well as urticaria/itching.   Asthma/Respiratory Symptom History: She has had asthma since she was very young. She was hospitalized multiple times as a child "under the tent". She is reporting that she is "feeling run down". She reports labored breathing and difficulty getting through the day. She has had several attacks over the last year. She goes to see her PCP, but at this point she is at a place where it is impacting her job and way of life. She denies day time coughing but does endorse some night time pressure. She has been on Singulair for 1-2 years. She was on  Advair at one point but she is unsure why it was stopped. She does have some night time coughing 2-3 nights per week. Her rescue inhaler use has decreased but when it happens she needs it multiple times in a row. At one point around one year ago, she was using the albuterol use was 4 times per week. Addition of the Singulair definitely helped.  Allergic Rhinitis Symptom History: She was on allergy shots in the distant past when she was much younger. This was in [place], [place]. As she has become older, she has developed asthma and problems with breathing. Her last allergy testing was performed when she started on allergy shots as a child. She is on Flonase and Zyrtec. She has tried changing her antihistamines intermittently. She has been off of her antihistamines for 72 hours. She did to [place] a couple of weeks ago and had worsening symptoms.   Urticaria Symptom History: She tells me today that she has itching. She definitely reports skin and eye itching. She has had hives 1-2 times, but this is not a constant thing for her at all. Itching is worse now than typical, but she does have symptoms throughout the year. 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  She tolerates all of the major food allergens without adverse event.   She had and does have a history of migraines as well as irritable bowel syndrome.  He also has bipolar 1 disorder.  Otherwise, there is no history of other atopic diseases, including drug allergies, food allergies, environmental allergies, stinging insect allergies. There is no significant infectious history. Vaccinations are up to date.    Past Medical History: Patient Active Problem List   Diagnosis Date Noted  . Moderate persistent asthma, uncomplicated 66/44/0347  . Seasonal and perennial allergic rhinitis 02/08/2018  . Obesity (BMI 30-39.9) 12/08/2017  . Allergic rhinitis 05/05/2017  . Easy bruising 03/15/2016  . Fatigue 03/11/2015  .  Vitamin D deficiency 03/11/2015  . Abnormal neurological exam 09/19/2014  . Other malaise and fatigue 03/12/2014  . LBP (low back pain) 03/12/2014  . Hyperlipidemia 11/08/2013  . Routine general medical examination at a health care facility 08/06/2013  . Sore on scalp 06/19/2013  . Quit smoking 06/19/2013  . Right shoulder pain 04/17/2013  . IBS (irritable bowel syndrome) 03/29/2013  . Bipolar I disorder ([place]) 02/26/2013  . GERD (gastroesophageal reflux disease) 02/26/2013  . Chronic diarrhea 02/26/2013  . Chronic migraine 02/26/2013    Medication List:  Allergies as of 02/08/2018      Reactions   Codeine Nausea Only   derivatives   Penicillins Rash, Other (See Comments)   "skin turns black"      Medication List        Accurate as of 02/08/18  5:40 PM. Always use your most recent med list.          albuterol (2.5 MG/3ML) 0.083% nebulizer solution Commonly known as:  PROVENTIL Take 3 mLs (2.5 mg total) by nebulization every 6 (six) hours as needed for wheezing or shortness of breath.   albuterol 108 (90 Base) MCG/ACT inhaler Commonly known as:  VENTOLIN HFA Inhale 1-2 puffs into the lungs every 6 (six) hours as needed for wheezing or shortness of breath.   ALPRAZolam 0.5 MG tablet Commonly known as:  XANAX Take 0.5 mg by mouth daily.   azelastine 0.1 % nasal spray Commonly known as:  ASTELIN Use 2 sprays per nostril 1-2 times daily   budesonide-formoterol 160-4.5 MCG/ACT inhaler Commonly known as:  SYMBICORT Inhale 2 puffs into the lungs 2 (two) times daily.   cetirizine 10 MG tablet Commonly known as:  ZYRTEC Take 10 mg by mouth daily.   cyclobenzaprine 10 MG tablet Commonly known as:  FLEXERIL Take 1 tablet (10 mg total) by mouth 3 (three) times daily as needed for muscle spasms.   EQUETRO 300 MG Cp12 Generic drug:  Carbamazepine Take 2 capsules by mouth at bedtime.   estradiol 1 MG tablet Commonly known as:  ESTRACE Take 1 mg by mouth at bedtime.     fluticasone 50 MCG/ACT nasal spray Commonly known as:  FLONASE Place 2 sprays into both nostrils daily.   meloxicam 15 MG tablet Commonly known as:  MOBIC TAKE 1 TABLET BY MOUTH EVERY DAY   montelukast 10 MG tablet Commonly known as:  SINGULAIR TAKE 1 TABLET BY MOUTH EVERYDAY AT BEDTIME   pantoprazole 40 MG tablet Commonly known as:  PROTONIX Take 1 tablet (40 mg total) by mouth daily.   pregabalin 75 MG capsule Commonly known as:  LYRICA Take 75 mg by mouth 2 (two) times daily.   SUMAtriptan 100 MG tablet Commonly known as:  IMITREX TAKE 1 TABLET BY MOUTH TWICE A DAY AS NEEDED  FOR MIGRAINE   Vitamin D (Ergocalciferol) 50000 units Caps capsule Commonly known as:  DRISDOL Take 1 capsule (50,000 Units total) by mouth every 7 (seven) days.       Birth History: non-contributory.   Developmental History: non-contributory.   Past Surgical History: Past Surgical History:  Procedure Laterality Date  . BREAST BIOPSY Left 12/28/2015  . CERVICAL FUSION    . CHOLECYSTECTOMY    . inguinal hernai repair    . KNEE SURGERY    . LAPAROSCOPIC HYSTERECTOMY    . TONSILLECTOMY       Family History: Family History  Problem Relation Age of Onset  . Depression Mother        Living  . Depression Father        Living  . Breast cancer Paternal Grandmother   . Heart disease Maternal Grandfather   . Depression Brother   . Depression Sister   . Colon cancer Neg Hx   . Esophageal cancer Neg Hx   . Rectal cancer Neg Hx   . Stomach cancer Neg Hx      Social History: Yadira lives at home by herself. 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 She worked for [employer]. She moved to [place] around 6 years ago.    Review of Systems: a 14-point review of systems is pertinent for what is mentioned in HPI.  Otherwise, all other systems were negative. Constitutional: negative other than that listed  in the HPI Eyes: negative other than that listed in the HPI Ears, nose, mouth, throat, and face: negative other than that listed in the HPI Respiratory: negative other than that listed in the HPI Cardiovascular: negative other than that listed in the HPI Gastrointestinal: negative other than that listed in the HPI Genitourinary: negative other than that listed in the HPI Integument: negative other than that listed in the HPI Hematologic: negative other than that listed in the HPI Musculoskeletal: negative other than that listed in the HPI Neurological: negative other than that listed in the HPI Allergy/Immunologic: negative other than that listed in the HPI    Objective:   Blood pressure 118/72, pulse 88, temperature 97.9 F (36.6 C), temperature source Oral, resp. rate 16, height 5\' 5"  (1.651 m), weight 216 lb (98 kg), SpO2 97 %. Body mass index is 35.94 kg/m.   Physical Exam:  General: Alert, interactive, in no acute distress. Pleasant female.  Eyes: No conjunctival injection bilaterally, no discharge on the right, no discharge on the left and no Horner-Trantas dots present. PERRL bilaterally. EOMI without pain. No photophobia.  Ears: Right TM pearly gray with normal light reflex, Left TM pearly gray with normal light reflex, Right TM intact without perforation and Left TM intact without perforation.  Nose/Throat: External nose within normal limits and septum midline. Turbinates edematous and pale with clear discharge. Posterior oropharynx erythematous with cobblestoning in the posterior oropharynx. Tonsils 2+ without exudates.  Tongue without thrush. Neck: Supple without thyromegaly. Trachea midline. Adenopathy: no enlarged lymph nodes appreciated in the anterior cervical, occipital, axillary, epitrochlear, inguinal, or popliteal regions. Lungs: Clear to auscultation without wheezing, rhonchi or rales. No increased work of breathing. CV: Normal S1/S2. No murmurs. Capillary refill  <2 seconds.  Abdomen: Nondistended, nontender. No guarding or rebound tenderness. Bowel sounds present in all fields and hypoactive  Skin: Warm and dry, without lesions or rashes. Extremities:  No clubbing, cyanosis or edema. Neuro:  Grossly intact. No focal deficits appreciated. Responsive to questions.  Diagnostic studies:   Spirometry: results normal (FEV1: 2.69/88%, FVC: 3.60/95%, FEV1/FVC: 75%).    Spirometry consistent with normal pattern.  Allergy Studies:   Indoor/Outdoor Percutaneous Adult Environmental Panel: positive to burweed marsh elder, short ragweed, giant ragweed, pine, black walnut pollen, Mucor, Fusarium, Rhizopus and epicoccum. Otherwise negative with adequate controls.  Indoor/Outdoor Selected Intradermal Environmental Panel: positive to mold mix #2 and mite mix. Otherwise negative with adequate controls.  Most Common Foods Panel (peanut, tree nuts, soy, fish mix, shellfish mix, wheat, milk, egg): negative to all with adequate controls   Allergy testing results were read and interpreted by myself, documented by clinical staff.       Salvatore Marvel, MD [facility]

## 2018-02-08 NOTE — Patient Instructions (Addendum)
1. Moderate persistent asthma, uncomplicated - Lung testing looked good today.  - Since you are having such shortness of breath problems, I think we should start a combined inhaled steroid and long-acting albuterol. - Spacer sample and demonstration provided. - Daily controller medication(s): Symbicort 160/4.56mcg two puffs twice daily with spacer - Prior to physical activity: ProAir 2 puffs 10-15 minutes before physical activity. - Rescue medications: ProAir 4 puffs every 4-6 hours as needed - Asthma control goals:  * Full participation in all desired activities (may need albuterol before activity) * Albuterol use two time or less a week on average (not counting use with activity) * Cough interfering with sleep two time or less a month * Oral steroids no more than once a year * No hospitalizations  2. Seasonal and perennial allergic rhinitis - Testing today showed: trees, weeds, indoor molds, outdoor molds and dust mites - Avoidance measures provided. - Stop taking: Zyrtec (but you can go back to it if you do not think the Allegra is doing anything more) - Continue with: Flonase (fluticasone) one spray per nostril twice daily - Start taking: Allegra (fexofenadine) 180mg  table once daily and Astelin (azelastine) 2 sprays per nostril 1-2 times daily as needed - You can use an extra dose of the antihistamine, if needed, for breakthrough symptoms.  - Consider nasal saline rinses 1-2 times daily to remove allergens from the nasal cavities as well as help with mucous clearance (this is especially helpful to do before the nasal sprays are given) - Consider allergy shots as a means of long-term control. - Allergy shots "re-train" and "reset" the immune system to ignore environmental allergens and decrease the resulting immune response to those allergens (sneezing, itchy watery eyes, runny nose, nasal congestion, etc).    - Allergy shots improve symptoms in 75-85% of patients.  - We can discuss more  at the next appointment if the medications are not working for you.  3. Itching - Hopefully the Allegra will help with the itching. - You can take an extra dose of the antihistamine as needed for particularly bad days. - It does not sound that foods are involved, and this was confirmed with the negative testing to the most common foods (peanut, sesame, cashew, soy, fish mix, shellfish mix, wheat, milk, casein, egg)  4. Return in about 3 months (around 05/11/2018).   Please inform us of any Emergency Department visits, hospitalizations, or changes in symptoms. Call us before going to the ED for breathing or allergy symptoms since we might be able to fit you in for a sick visit. Feel free to contact us anytime with any questions, problems, or concerns.  It was a pleasure to meet you today!  Websites that have reliable patient information: 1. American Academy of Asthma, Allergy, and Immunology: www.aaaai.org 2. Food Allergy Research and Education (FARE): foodallergy.org 3. Mothers of Asthmatics: http://www.asthmacommunitynetwork.org 4. American College of Allergy, Asthma, and Immunology: www.acaai.org    Reducing Pollen Exposure  The American Academy of Allergy, Asthma and Immunology suggests the following steps to reduce your exposure to pollen during allergy seasons.    1. Do not hang sheets or clothing out to dry; pollen may collect on these items. 2. Do not mow lawns or spend time around freshly cut grass; mowing stirs up pollen. 3. Keep windows closed at night.  Keep car windows closed while driving. 4. Minimize morning activities outdoors, a time when pollen counts are usually at their highest. 5. Stay indoors as much as possible  when pollen counts or humidity is high and on windy days when pollen tends to remain in the air longer. 6. Use air conditioning when possible.  Many air conditioners have filters that trap the pollen spores. 7. Use a HEPA room air filter to remove pollen  form the indoor air you breathe.  Control of Mold Allergen   Mold and fungi can grow on a variety of surfaces provided certain temperature and moisture conditions exist.  Outdoor molds grow on plants, decaying vegetation and soil.  The major outdoor mold, Alternaria and Cladosporium, are found in very high numbers during hot and dry conditions.  Generally, a late Summer - Fall peak is seen for common outdoor fungal spores.  Rain will temporarily lower outdoor mold spore count, but counts rise rapidly when the rainy period ends.  The most important indoor molds are Aspergillus and Penicillium.  Dark, humid and poorly ventilated basements are ideal sites for mold growth.  The next most common sites of mold growth are the bathroom and the kitchen.  Outdoor (Seasonal) Mold Control  Positive outdoor molds via skin testing: Mucor  1. Use air conditioning and keep windows closed 2. Avoid exposure to decaying vegetation. 3. Avoid leaf raking. 4. Avoid grain handling. 5. Consider wearing a face mask if working in moldy areas.  6.   Indoor (Perennial) Mold Control   Positive indoor molds via skin testing: Aspergillus, Penicillium, Fusarium and Rhizopus  1. Maintain humidity below 50%. 2. Clean washable surfaces with 5% bleach solution. 3. Remove sources e.g. contaminated carpets.     Control of House Dust Mite Allergen    House dust mites play a major role in allergic asthma and rhinitis.  They occur in environments with high humidity wherever human skin, the food for dust mites is found. High levels have been detected in dust obtained from mattresses, pillows, carpets, upholstered furniture, bed covers, clothes and soft toys.  The principal allergen of the house dust mite is found in its feces.  A gram of dust may contain 1,000 mites and 250,000 fecal particles.  Mite antigen is easily measured in the air during house cleaning activities.    1. Encase mattresses, including the box spring,  and pillow, in an air tight cover.  Seal the zipper end of the encased mattresses with wide adhesive tape. 2. Wash the bedding in water of 130 degrees Farenheit weekly.  Avoid cotton comforters/quilts and flannel bedding: the most ideal bed covering is the dacron comforter. 3. Remove all upholstered furniture from the bedroom. 4. Remove carpets, carpet padding, rugs, and non-washable window drapes from the bedroom.  Wash drapes weekly or use plastic window coverings. 5. Remove all non-washable stuffed toys from the bedroom.  Wash stuffed toys weekly. 6. Have the room cleaned frequently with a vacuum cleaner and a damp dust-mop.  The patient should not be in a room which is being cleaned and should wait 1 hour after cleaning before going into the room. 7. Close and seal all heating outlets in the bedroom.  Otherwise, the room will become filled with dust-laden air.  An electric heater can be used to heat the room. 8. Reduce indoor humidity to less than 50%.  Do not use a humidifier.  Allergy Shots   Allergies are the result of a chain reaction that starts in the immune system. Your immune system controls how your body defends itself. For instance, if you have an allergy to pollen, your immune system identifies pollen as an invader  or allergen. Your immune system overreacts by producing antibodies called Immunoglobulin E (IgE). These antibodies travel to cells that release chemicals, causing an allergic reaction.  The concept behind allergy immunotherapy, whether it is received in the form of shots or tablets, is that the immune system can be desensitized to specific allergens that trigger allergy symptoms. Although it requires time and patience, the payback can be long-term relief.  How Do Allergy Shots Work?  Allergy shots work much like a vaccine. Your body responds to injected amounts of a particular allergen given in increasing doses, eventually developing a resistance and tolerance to it.  Allergy shots can lead to decreased, minimal or no allergy symptoms.  There generally are two phases: build-up and maintenance. Build-up often ranges from three to six months and involves receiving injections with increasing amounts of the allergens. The shots are typically given once or twice a week, though more rapid build-up schedules are sometimes used.  The maintenance phase begins when the most effective dose is reached. This dose is different for each person, depending on how allergic you are and your response to the build-up injections. Once the maintenance dose is reached, there are longer periods between injections, typically two to four weeks.  Occasionally doctors give cortisone-type shots that can temporarily reduce allergy symptoms. These types of shots are different and should not be confused with allergy immunotherapy shots.  Who Can Be Treated with Allergy Shots?  Allergy shots may be a good treatment approach for people with allergic rhinitis (hay fever), allergic asthma, conjunctivitis (eye allergy) or stinging insect allergy.   Before deciding to begin allergy shots, you should consider:  . The length of allergy season and the severity of your symptoms . Whether medications and/or changes to your environment can control your symptoms . Your desire to avoid long-term medication use . Time: allergy immunotherapy requires a major time commitment . Cost: may vary depending on your insurance coverage  Allergy shots for children age 12 and older are effective and often well tolerated. They might prevent the onset of new allergen sensitivities or the progression to asthma.  Allergy shots are not started on patients who are pregnant but can be continued on patients who become pregnant while receiving them. In some patients with other medical conditions or who take certain common medications, allergy shots may be of risk. It is important to mention other medications you talk to  your allergist.   When Will I Feel Better?  Some may experience decreased allergy symptoms during the build-up phase. For others, it may take as long as 12 months on the maintenance dose. If there is no improvement after a year of maintenance, your allergist will discuss other treatment options with you.  If you aren't responding to allergy shots, it may be because there is not enough dose of the allergen in your vaccine or there are missing allergens that were not identified during your allergy testing. Other reasons could be that there are high levels of the allergen in your environment or major exposure to non-allergic triggers like tobacco smoke.  What Is the Length of Treatment?  Once the maintenance dose is reached, allergy shots are generally continued for three to five years. The decision to stop should be discussed with your allergist at that time. Some people may experience a permanent reduction of allergy symptoms. Others may relapse and a longer course of allergy shots can be considered.  What Are the Possible Reactions?  The two types of adverse  reactions that can occur with allergy shots are local and systemic. Common local reactions include very mild redness and swelling at the injection site, which can happen immediately or several hours after. A systemic reaction, which is less common, affects the entire body or a particular body system. They are usually mild and typically respond quickly to medications. Signs include increased allergy symptoms such as sneezing, a stuffy nose or hives.  Rarely, a serious systemic reaction called anaphylaxis can develop. Symptoms include swelling in the throat, wheezing, a feeling of tightness in the chest, nausea or dizziness. Most serious systemic reactions develop within 30 minutes of allergy shots. This is why it is strongly recommended you wait in your doctor's office for 30 minutes after your injections. Your allergist is trained to watch for  reactions, and his or her staff is trained and equipped with the proper medications to identify and treat them.  Who Should Administer Allergy Shots?  The preferred location for receiving shots is your prescribing allergist's office. Injections can sometimes be given at another facility where the physician and staff are trained to recognize and treat reactions, and have received instructions by your prescribing allergist.

## 2018-02-09 NOTE — Progress Notes (Signed)
Thanks Dr. Ernst Bowler.  We will watch for this one.

## 2018-02-12 DIAGNOSIS — M1711 Unilateral primary osteoarthritis, right knee: Secondary | ICD-10-CM | POA: Diagnosis not present

## 2018-02-21 DIAGNOSIS — M1711 Unilateral primary osteoarthritis, right knee: Secondary | ICD-10-CM | POA: Diagnosis not present

## 2018-02-22 ENCOUNTER — Other Ambulatory Visit: Payer: Self-pay | Admitting: Family Medicine

## 2018-02-23 NOTE — Telephone Encounter (Signed)
Last OV 12/29/17, No future OV  Last filled 12/29/17, # 30 with 0 refills

## 2018-02-26 ENCOUNTER — Encounter: Payer: Self-pay | Admitting: Neurology

## 2018-03-03 ENCOUNTER — Other Ambulatory Visit: Payer: Self-pay | Admitting: Family Medicine

## 2018-03-06 ENCOUNTER — Encounter: Payer: Self-pay | Admitting: Neurology

## 2018-03-25 ENCOUNTER — Encounter: Payer: Self-pay | Admitting: Internal Medicine

## 2018-03-26 ENCOUNTER — Other Ambulatory Visit: Payer: Self-pay

## 2018-03-26 MED ORDER — SUCRALFATE 1 GM/10ML PO SUSP: 1.0000 g | Freq: Four times a day (QID) | ORAL | 1 refills | Status: DC

## 2018-03-26 NOTE — Telephone Encounter (Signed)
Ok for sucralfate suspension 1 g TIDAC and HS x 1 month

## 2018-03-27 ENCOUNTER — Other Ambulatory Visit: Payer: Self-pay | Admitting: Neurology

## 2018-03-27 ENCOUNTER — Other Ambulatory Visit: Payer: Self-pay | Admitting: Family Medicine

## 2018-04-01 ENCOUNTER — Other Ambulatory Visit: Payer: Self-pay | Admitting: Family Medicine

## 2018-05-01 ENCOUNTER — Encounter: Payer: Self-pay | Admitting: Physician Assistant

## 2018-05-01 ENCOUNTER — Ambulatory Visit: Payer: 59 | Admitting: Physician Assistant

## 2018-05-01 ENCOUNTER — Other Ambulatory Visit: Payer: Self-pay

## 2018-05-01 VITALS — BP 122/78 | HR 83 | Temp 97.7°F | Resp 14 | Ht 65.0 in | Wt 205.0 lb

## 2018-05-01 DIAGNOSIS — R5383 Other fatigue: Secondary | ICD-10-CM | POA: Diagnosis not present

## 2018-05-01 DIAGNOSIS — B349 Viral infection, unspecified: Secondary | ICD-10-CM

## 2018-05-01 LAB — CBC WITH DIFFERENTIAL/PLATELET
BASOS PCT: 0.7 % (ref 0.0–3.0)
Basophils Absolute: 0.1 10*3/uL (ref 0.0–0.1)
EOS ABS: 0 10*3/uL (ref 0.0–0.7)
Eosinophils Relative: 0.3 % (ref 0.0–5.0)
HEMATOCRIT: 38.5 % (ref 36.0–46.0)
Hemoglobin: 13.4 g/dL (ref 12.0–15.0)
LYMPHS ABS: 1.9 10*3/uL (ref 0.7–4.0)
Lymphocytes Relative: 19.8 % (ref 12.0–46.0)
MCHC: 34.9 g/dL (ref 30.0–36.0)
MCV: 92.6 fl (ref 78.0–100.0)
Monocytes Absolute: 0.5 10*3/uL (ref 0.1–1.0)
Monocytes Relative: 5.5 % (ref 3.0–12.0)
NEUTROS ABS: 7 10*3/uL (ref 1.4–7.7)
Neutrophils Relative %: 73.7 % (ref 43.0–77.0)
PLATELETS: 317 10*3/uL (ref 150.0–400.0)
RBC: 4.16 Mil/uL (ref 3.87–5.11)
RDW: 12.6 % (ref 11.5–15.5)
WBC: 9.5 10*3/uL (ref 4.0–10.5)

## 2018-05-01 LAB — VITAMIN D 25 HYDROXY (VIT D DEFICIENCY, FRACTURES): VITD: 42.31 ng/mL (ref 30.00–100.00)

## 2018-05-01 NOTE — Patient Instructions (Signed)
Please stay well-hydrated and get plenty of rest.  Alternate tylenol and ibuprofen if needed for aches. We will check for anemia and recurrent vitamin d deficiency.  I will set you up with a rheumatologist in the area for further management of rheumatology.

## 2018-05-01 NOTE — Progress Notes (Signed)
Patient presents to clinic today c/o 4 days of fatigue with sore neck glands associated with some sinus pressure. Denies fever, chills, SOB, chest pain. Denies any change to bowel or bladder habits. Denies recent travel or sick contact. Denies any other URI symptoms. Has not taken anything for symptoms. Does note prior history of anemia and Vitamin D deficiency that she feels may be contributing. Would like checked today.  Past Medical History:  Diagnosis Date  . Alcoholism ([place])   . Allergy   . Anemia   . Anxiety   . Arthritis   . Asthma   . Chronic gastritis    with intestinal metaplasia  . Depression   . External hemorrhoids   . GERD (gastroesophageal reflux disease)   . Hyperlipidemia   . IBS (irritable bowel syndrome)   . Migraine     Current Outpatient Medications on File Prior to Visit  Medication Sig Dispense Refill  . albuterol (PROVENTIL) (2.5 MG/3ML) 0.083% nebulizer solution Take 3 mLs (2.5 mg total) by nebulization every 6 (six) hours as needed for wheezing or shortness of breath. 150 mL 1  . albuterol (VENTOLIN HFA) 108 (90 Base) MCG/ACT inhaler Inhale 1-2 puffs into the lungs every 6 (six) hours as needed for wheezing or shortness of breath. 18 g 1  . ALPRAZolam (XANAX) 0.5 MG tablet Take 0.5 mg by mouth daily.   2  . azelastine (ASTELIN) 0.1 % nasal spray Use 2 sprays per nostril 1-2 times daily 30 mL 5  . budesonide-formoterol (SYMBICORT) 160-4.5 MCG/ACT inhaler Inhale 2 puffs into the lungs 2 (two) times daily. 1 Inhaler 5  . cetirizine (ZYRTEC) 10 MG tablet Take 10 mg by mouth daily.    . cyclobenzaprine (FLEXERIL) 10 MG tablet TAKE 1 TABLET BY MOUTH THREE TIMES A DAY AS NEEDED FOR MUSCLE SPASMS 30 tablet 0  . EQUETRO 300 MG CP12 Take 2 capsules by mouth at bedtime.   5  . estradiol (ESTRACE) 1 MG tablet Take 1 mg by mouth at bedtime.    . fluticasone (FLONASE) 50 MCG/ACT nasal spray Place 2 sprays into both nostrils daily.    . pantoprazole (PROTONIX) 40 MG  tablet Take 1 tablet (40 mg total) by mouth daily. 90 tablet 1  . pregabalin (LYRICA) 75 MG capsule Take 75 mg by mouth 2 (two) times daily.    . SUMAtriptan (IMITREX) 100 MG tablet TAKE 1 TABLET BY MOUTH TWICE A DAY AS NEEDED FOR MIGRAINE 10 tablet 3   No current facility-administered medications on file prior to visit.     Allergies  Allergen Reactions  . Codeine Nausea Only    derivatives  . Penicillins Rash and Other (See Comments)    "skin turns black"    Family History  Problem Relation Age of Onset  . Depression Mother        Living  . Depression Father        Living  . Breast cancer Paternal Grandmother   . Heart disease Maternal Grandfather   . Depression Brother   . Depression Sister   . Colon cancer Neg Hx   . Esophageal cancer Neg Hx   . Rectal cancer Neg Hx   . Stomach cancer Neg Hx     Social History   Socioeconomic History  . Marital status: Single    Spouse name: Not on file  . Number of children: 0  . Years of education: Not on file  . Highest education level: Not on file  Occupational History  . Occupation: Scientist, clinical (histocompatibility and immunogenetics): [employer]  . Financial resource strain: Not on file  . Food insecurity:    Worry: Not on file    Inability: Not on file  . Transportation needs:    Medical: Not on file    Non-medical: Not on file  Tobacco Use  . Smoking status: Former Smoker    Packs/day: 0.75    Years: 9.00    Pack years: 6.75    Types: Cigarettes    Last attempt to quit: 09/18/2014    Years since quitting: 3.6  . Smokeless tobacco: Never Used  . Tobacco comment: Quit in December 2015.  Substance and Sexual Activity  . Alcohol use: Yes    Alcohol/week: 0.0 oz    Comment: in recovery, none  . Drug use: No    Comment: in recovery  . Sexual activity: Not on file  Lifestyle  . Physical activity:    Days per week: Not on file    Minutes per session: Not on file  . Stress: Not on file  Relationships  . Social connections:     Talks on phone: Not on file    Gets together: Not on file    Attends religious service: Not on file    Active member of club or organization: Not on file    Attends meetings of clubs or organizations: Not on file    Relationship status: Not on file  Other Topics Concern  . Not on file  Social History Narrative   She lives alone.  No children.   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   Highest level of education:  B.S.   Review of Systems - See HPI.  All other ROS are negative.  BP 122/78   Pulse 83   Temp 97.7 F (36.5 C) (Oral)   Resp 14   Ht 5\' 5"  (1.651 m)   Wt 205 lb (93 kg)   SpO2 99%   BMI 34.11 kg/m   Physical Exam  Constitutional: She is oriented to person, place, and time. She appears well-developed and well-nourished.  HENT:  Head: Normocephalic and atraumatic.  Right Ear: External ear normal.  Left Ear: External ear normal.  Nose: Nose normal.  Mouth/Throat: Oropharynx is clear and moist.  Eyes: Pupils are equal, round, and reactive to light. Conjunctivae are normal.  Neck: Neck supple.  Cardiovascular: Normal rate, regular rhythm, normal heart sounds and intact distal pulses.  Pulmonary/Chest: Effort normal and breath sounds normal.  Lymphadenopathy:    She has no cervical adenopathy.  Neurological: She is alert and oriented to person, place, and time.  Psychiatric: She has a normal mood and affect.  Vitals reviewed.  Assessment/Plan: 1. Fatigue, unspecified type Seems mainly related to viral illness (see below). Giving history, will check Vit D and CBC today. - Vitamin D (25 hydroxy) - CBC w/Diff  2. Viral illness Examination unremarkable. Afebrile illness. Supportive measures and OTC medications reviewed. Patient to call with any new or changing symptoms.    Leeanne Rio, PA-C

## 2018-05-02 ENCOUNTER — Other Ambulatory Visit: Payer: Self-pay | Admitting: Emergency Medicine

## 2018-05-02 DIAGNOSIS — E559 Vitamin D deficiency, unspecified: Secondary | ICD-10-CM

## 2018-05-02 MED ORDER — VITAMIN D3 25 MCG (1000 UT) PO CAPS: 1.0000 | ORAL_CAPSULE | Freq: Every day | ORAL | 0 refills | Status: DC

## 2018-05-03 ENCOUNTER — Encounter: Payer: Self-pay | Admitting: Physician Assistant

## 2018-06-02 ENCOUNTER — Other Ambulatory Visit: Payer: Self-pay | Admitting: Internal Medicine

## 2018-06-02 ENCOUNTER — Other Ambulatory Visit: Payer: Self-pay | Admitting: Physician Assistant

## 2018-06-02 DIAGNOSIS — E559 Vitamin D deficiency, unspecified: Secondary | ICD-10-CM

## 2018-07-02 ENCOUNTER — Encounter: Payer: Self-pay | Admitting: Allergy & Immunology

## 2018-07-07 ENCOUNTER — Other Ambulatory Visit: Payer: Self-pay | Admitting: Family Medicine

## 2018-07-07 DIAGNOSIS — E559 Vitamin D deficiency, unspecified: Secondary | ICD-10-CM

## 2018-07-10 ENCOUNTER — Other Ambulatory Visit: Payer: Self-pay | Admitting: Family Medicine

## 2018-07-10 DIAGNOSIS — E559 Vitamin D deficiency, unspecified: Secondary | ICD-10-CM

## 2018-07-12 ENCOUNTER — Encounter: Payer: Self-pay | Admitting: Allergy & Immunology

## 2018-07-12 ENCOUNTER — Ambulatory Visit: Payer: 59 | Admitting: Allergy & Immunology

## 2018-07-12 VITALS — BP 124/72 | HR 68 | Resp 18

## 2018-07-12 DIAGNOSIS — K219 Gastro-esophageal reflux disease without esophagitis: Secondary | ICD-10-CM | POA: Diagnosis not present

## 2018-07-12 DIAGNOSIS — J4540 Moderate persistent asthma, uncomplicated: Secondary | ICD-10-CM

## 2018-07-12 DIAGNOSIS — J302 Other seasonal allergic rhinitis: Secondary | ICD-10-CM | POA: Diagnosis not present

## 2018-07-12 DIAGNOSIS — J3089 Other allergic rhinitis: Secondary | ICD-10-CM

## 2018-07-12 MED ORDER — FLUTICASONE PROPIONATE 50 MCG/ACT NA SUSP: 1.0000 | Freq: Every day | NASAL | 5 refills | Status: DC

## 2018-07-12 MED ORDER — AZELASTINE HCL 0.1 % NA SOLN: NASAL | 5 refills | Status: AC

## 2018-07-12 MED ORDER — ALBUTEROL SULFATE HFA 108 (90 BASE) MCG/ACT IN AERS: 1.0000 | INHALATION_SPRAY | Freq: Four times a day (QID) | RESPIRATORY_TRACT | 1 refills | Status: AC | PRN

## 2018-07-12 NOTE — Patient Instructions (Addendum)
1. Moderate persistent asthma, uncomplicated - Lung testing looked good today, but there was SOME improvement with the nebulizer treatment. - Daily controller medication(s): Symbicort 160/4.53mcg two puffs twice daily with spacer - Prior to physical activity: ProAir 2 puffs 10-15 minutes before physical activity. - Rescue medications: ProAir 4 puffs every 4-6 hours as needed - Asthma control goals:  * Full participation in all desired activities (may need albuterol before activity) * Albuterol use two time or less a week on average (not counting use with activity) * Cough interfering with sleep two time or less a month * Oral steroids no more than once a year * No hospitalizations  2. Seasonal and perennial allergic rhinitis (trees, weeds, indoor molds, outdoor molds and dust mites) - Stop taking: Zyrtec and Allegra for now (use Ryvent instead to see if this helps with the postnasal drip)  - Continue with: Flonase (fluticasone) one spray per nostril twice daily and Astelin (azelastine) 2 sprays per nostril 1-2 times daily as needed  - Try using Ryvent one tablet every 6-8 hours (this can cause some sleepiness, so be aware of that).  - I am hopeful that this will help improve the postnasal drip. - Email me with an update in 1-2 weeks: [email or web address] - Consider allergy shots in the future.  3. Return in about 3 months (around 10/12/2018).   Please inform us of any Emergency Department visits, hospitalizations, or changes in symptoms. Call us before going to the ED for breathing or allergy symptoms since we might be able to fit you in for a sick visit. Feel free to contact us anytime with any questions, problems, or concerns.  It was a pleasure to see you again today!  Websites that have reliable patient information: 1. American Academy of Asthma, Allergy, and Immunology: www.aaaai.org 2. Food Allergy Research and Education (FARE): foodallergy.org 3. Mothers of Asthmatics:  http://www.asthmacommunitynetwork.org 4. American College of Allergy, Asthma, and Immunology: www.acaai.org

## 2018-07-12 NOTE — Progress Notes (Signed)
FOLLOW UP  Date of Service/Encounter:  07/12/18   Assessment:   Moderate persistent asthma without complication  Seasonal and perennial allergic rhinitis (trees, weeds, indoor molds, outdoor molds and dust mites)   [name] presents today with continued coughing.  She reports that she has had 4 exacerbations since the last visit.  However, it seems that she means she has had coughing episodes which have required albuterol.  During what she calls exacerbations, she has not required any prednisone or hospitalizations.  Today, she does have a slight improvement in her forced vital capacity, which technically does meet the criteria for reversibility.  However, she is endorsing some sinus pain and pressure, therefore I conjecture that this continued cough is related to postnasal drip rather than asthma.  She does tell me today that the addition of the Symbicort does not seem to have helped.  Therefore, in the future, we can certainly consider stepping down her asthma therapy once we have her postnasal drip under better control.  Allergy shots continue to be a good option for her, but she is hesitant to committing to that.   Plan/Recommendations:   1. Moderate persistent asthma, uncomplicated - Lung testing looked good today, but there was SOME improvement with the nebulizer treatment. - Daily controller medication(s): Symbicort 160/4.27mcg two puffs twice daily with spacer - Prior to physical activity: ProAir 2 puffs 10-15 minutes before physical activity. - Rescue medications: ProAir 4 puffs every 4-6 hours as needed - Asthma control goals:  * Full participation in all desired activities (may need albuterol before activity) * Albuterol use two time or less a week on average (not counting use with activity) * Cough interfering with sleep two time or less a month * Oral steroids no more than once a year * No hospitalizations  2. Seasonal and perennial allergic rhinitis (trees, weeds, indoor  molds, outdoor molds and dust mites) - Stop taking: Zyrtec and Allegra for now (use Ryvent instead to see if this helps with the postnasal drip)  - Continue with: Flonase (fluticasone) one spray per nostril twice daily and Astelin (azelastine) 2 sprays per nostril 1-2 times daily as needed  - Try using Ryvent one tablet every 6-8 hours (this can cause some sleepiness, so be aware of that).  - I am hopeful that this will help improve the postnasal drip. - Email me with an update in 1-2 weeks: [email or web address] - Consider allergy shots in the future.  3. Return in about 3 months (around 10/12/2018).  Subjective:   Delara Shepheard is a 42 y.o. female presenting today for follow up of  Chief Complaint  Patient presents with  . Asthma    worsening since last visit. 4x full blown exacerbations where she had very scary breathing issues.   . Nasal Congestion    ongoing for a couple of months. some post nasal drainage and pressure in her head.     Maurilio Lovely has a history of the following: Patient Active Problem List   Diagnosis Date Noted  . Moderate persistent asthma, uncomplicated 24/40/1027  . Seasonal and perennial allergic rhinitis 02/08/2018  . Obesity (BMI 30-39.9) 12/08/2017  . Allergic rhinitis 05/05/2017  . Easy bruising 03/15/2016  . Fatigue 03/11/2015  . Vitamin D deficiency 03/11/2015  . Abnormal neurological exam 09/19/2014  . Other malaise and fatigue 03/12/2014  . LBP (low back pain) 03/12/2014  . Hyperlipidemia 11/08/2013  . Routine general medical examination at a health care facility 08/06/2013  . Sore on scalp 06/19/2013  .  Quit smoking 06/19/2013  . Right shoulder pain 04/17/2013  . IBS (irritable bowel syndrome) 03/29/2013  . Bipolar I disorder ([place]) 02/26/2013  . GERD (gastroesophageal reflux disease) 02/26/2013  . Chronic diarrhea 02/26/2013  . Chronic migraine 02/26/2013    History obtained from: chart review and patient.  [facility] Primary Care Provider is Midge Minium, MD.     Nicholas is a 41 y.o. female presenting for a follow up visit.  She was first seen in May 2019.  At that time, I diagnosed her with moderate persistent asthma.  We started her on Symbicort 160/4.5 mcg 2 puffs twice daily with pro-air as needed.  Prior to this, she had been on daily medications intermittently.  She had testing that was positive to trees, weeds, indoor and outdoor molds, and dust mites.  We stopped Zyrtec and started Allegra instead.  We also continued her on Flonase 1 spray per nostril twice daily.  In addition, we started as a lasting 2 sprays per nostril up to twice daily.  We did briefly discuss allergy shots.  She also endorses reaching without the presence of urticaria.  Due to patient concern with food allergies, we did testing to the most common foods, which was negative.  Since the last visit, she has done fairly well. She did institute all of the allergy acoidance measures. Dust mites was particularly bad and she made all of t. Her entire constellation of symptoms get worse when she goes to [facility] for whatever reason.   Asthma/Respiratory Symptom History: She remains on her Symbicort 160/4.5 two puffs BID.  Overall, she is not convinced that the Symbicort has helped much.  The breathing episodes can occur at work but they also occur at home. She has had four attacks since hte last ivsit which were relieved with the rescue inhaler.  During these episodes, she reports tightness as well as occasional midline chest pain.  The symptoms are completely relieved with the albuterol, which is why she thinks this is related to asthma.  ACT score today is 18, indicating subpar asthma control.  Of note, she does have a history of GERD and is on a proton pump inhibitor.  Allergic Rhinitis Symptom History: She does not feel that the Allegra did any different than the Zyrtec, but she remains on in any way.  She is on fluticasone as  well as the as a lasting nasal sprays.  She has been compliant with all of her medications.  She did go home and institute allergen avoidance measures, with mixed results.  Otherwise, there have been no changes to her past medical history, surgical history, family history, or social history.    Review of Systems: a 14-point review of systems is pertinent for what is mentioned in HPI.  Otherwise, all other systems were negative.  Constitutional: negative other than that listed in the HPI Eyes: negative other than that listed in the HPI Ears, nose, mouth, throat, and face: negative other than that listed in the HPI Respiratory: negative other than that listed in the HPI Cardiovascular: negative other than that listed in the HPI Gastrointestinal: negative other than that listed in the HPI Genitourinary: negative other than that listed in the HPI Integument: negative other than that listed in the HPI Hematologic: negative other than that listed in the HPI Musculoskeletal: negative other than that listed in the HPI Neurological: negative other than that listed in the HPI Allergy/Immunologic: negative other than that listed in the HPI  Objective:   Blood pressure 124/72, pulse 68, resp. rate 18. There is no height or weight on file to calculate BMI.   Physical Exam:  General: Alert, interactive, in no acute distress. Pleasant female.  Eyes: No conjunctival injection bilaterally, no discharge on the right, no discharge on the left and no Horner-Trantas dots present. PERRL bilaterally. EOMI without pain. No photophobia.  Ears: Right TM pearly gray with normal light reflex, Left TM pearly gray with normal light reflex, Right TM intact without perforation and Left TM intact without perforation.  Nose/Throat: External nose within normal limits and septum midline. Turbinates edematous with clear discharge. Posterior oropharynx erythematous without cobblestoning in the posterior oropharynx.  Tonsils 2+ without exudates.  Tongue without thrush. Lungs: Clear to auscultation without wheezing, rhonchi or rales. No increased work of breathing. CV: Normal S1/S2. No murmurs. Capillary refill <2 seconds.  Skin: Warm and dry, without lesions or rashes. Neuro:   Grossly intact. No focal deficits appreciated. Responsive to questions.  Diagnostic studies:   Spirometry: results normal (FEV1: 2.61/86%, FVC: 3.13/83%, FEV1/FVC: 83%).    Spirometry consistent with normal pattern. Xopenex/Atrovent nebulizer treatment given in clinic with significant improvement in FVC per ATS criteria. However, se did not feel any different aside from increased mucous production.  Allergy Studies: none     Salvatore Marvel, MD  [facility]

## 2018-07-17 DIAGNOSIS — Z23 Encounter for immunization: Secondary | ICD-10-CM | POA: Diagnosis not present

## 2018-07-18 ENCOUNTER — Encounter: Payer: Self-pay | Admitting: Allergy & Immunology

## 2018-07-18 MED ORDER — CARBINOXAMINE MALEATE 6 MG PO TABS: 1.0000 | ORAL_TABLET | ORAL | 5 refills | Status: DC

## 2018-07-25 MED ORDER — CARBINOXAMINE MALEATE 6 MG PO TABS: 1.0000 | ORAL_TABLET | ORAL | 5 refills | Status: AC

## 2018-07-25 NOTE — Addendum Note (Signed)
Addended by: Lucrezia Starch I on: 07/25/2018 04:43 PM   Modules accepted: Orders
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
## 2018-08-29 ENCOUNTER — Encounter: Payer: Self-pay | Admitting: Neurology

## 2018-08-29 ENCOUNTER — Ambulatory Visit: Payer: 59 | Admitting: Neurology

## 2018-08-29 VITALS — BP 120/80 | HR 74 | Ht 65.0 in | Wt 207.4 lb

## 2018-08-29 DIAGNOSIS — G43009 Migraine without aura, not intractable, without status migrainosus: Secondary | ICD-10-CM | POA: Diagnosis not present

## 2018-08-29 DIAGNOSIS — M542 Cervicalgia: Secondary | ICD-10-CM | POA: Diagnosis not present

## 2018-08-29 MED ORDER — KETOROLAC TROMETHAMINE 60 MG/2ML IM SOLN
60.0000 mg | Freq: Once | INTRAMUSCULAR | Status: AC
  Administered 2018-08-29: 60 mg via INTRAMUSCULAR

## 2018-08-29 MED ORDER — PREGABALIN 75 MG PO CAPS: 75.0000 mg | ORAL_CAPSULE | Freq: Two times a day (BID) | ORAL | 5 refills | Status: DC

## 2018-08-29 MED ORDER — DICLOFENAC POTASSIUM(MIGRAINE) 50 MG PO PACK: 50.0000 mg | PACK | ORAL | 0 refills | Status: DC | PRN

## 2018-08-29 NOTE — Patient Instructions (Addendum)
Continue Lyrica 75mg  twice daily  Start neck physiotherapy  Samples provided for Cambia.  Unfortunately, we are out of samples for Migranal nasal spray and will call you when it is available to pick up.  If this works for you, please call my office and I will send a prescription  Return to clinic in 6 months

## 2018-09-09 ENCOUNTER — Encounter: Payer: Self-pay | Admitting: Family Medicine

## 2018-09-12 ENCOUNTER — Encounter: Payer: Self-pay | Admitting: *Deleted

## 2018-09-12 MED ORDER — DIHYDROERGOTAMINE MESYLATE 4 MG/ML NA SOLN: 1.0000 | NASAL | 0 refills | Status: DC | PRN

## 2018-09-13 MED ORDER — LYRICA 100 MG PO CAPS: 100.0000 mg | ORAL_CAPSULE | Freq: Two times a day (BID) | ORAL | 5 refills | Status: DC

## 2018-10-16 ENCOUNTER — Ambulatory Visit: Payer: 59 | Admitting: Allergy & Immunology

## 2018-10-16 ENCOUNTER — Telehealth: Payer: Self-pay | Admitting: Internal Medicine

## 2018-10-16 NOTE — Telephone Encounter (Signed)
Sent a message on [place] requesting an appointment. Called her to get her scheduled but she informed me that she has moved to [place]. Looking for local GI there.

## 2018-11-09 MED ORDER — PANTOPRAZOLE SODIUM 40 MG PO TBEC: 40.0000 mg | DELAYED_RELEASE_TABLET | Freq: Every day | ORAL | 0 refills | Status: AC

## 2018-11-26 ENCOUNTER — Encounter: Payer: Self-pay | Admitting: Family Medicine

## 2018-11-26 DIAGNOSIS — K219 Gastro-esophageal reflux disease without esophagitis: Secondary | ICD-10-CM

## 2018-11-27 ENCOUNTER — Other Ambulatory Visit: Payer: Self-pay | Admitting: *Deleted

## 2018-11-27 MED ORDER — LYRICA 100 MG PO CAPS: 100.0000 mg | ORAL_CAPSULE | Freq: Two times a day (BID) | ORAL | 5 refills | Status: DC

## 2018-11-27 MED ORDER — PREGABALIN 100 MG PO CAPS: 100.0000 mg | ORAL_CAPSULE | Freq: Two times a day (BID) | ORAL | 5 refills | Status: DC

## 2019-01-30 ENCOUNTER — Encounter: Payer: Self-pay | Admitting: Neurology

## 2019-01-30 ENCOUNTER — Encounter: Payer: Self-pay | Admitting: *Deleted

## 2019-01-30 ENCOUNTER — Other Ambulatory Visit: Payer: Self-pay

## 2019-01-30 ENCOUNTER — Telehealth (INDEPENDENT_AMBULATORY_CARE_PROVIDER_SITE_OTHER): Payer: 59 | Admitting: Neurology

## 2019-01-30 VITALS — Ht 65.5 in | Wt 194.0 lb

## 2019-01-30 DIAGNOSIS — G43709 Chronic migraine without aura, not intractable, without status migrainosus: Secondary | ICD-10-CM

## 2019-01-30 DIAGNOSIS — IMO0002 Reserved for concepts with insufficient information to code with codable children: Secondary | ICD-10-CM

## 2019-01-30 DIAGNOSIS — G43009 Migraine without aura, not intractable, without status migrainosus: Secondary | ICD-10-CM | POA: Diagnosis not present

## 2019-01-30 DIAGNOSIS — M542 Cervicalgia: Secondary | ICD-10-CM

## 2019-01-30 MED ORDER — SUMATRIPTAN SUCCINATE 100 MG PO TABS: ORAL_TABLET | ORAL | 5 refills | Status: AC

## 2019-01-30 MED ORDER — TOPIRAMATE 25 MG PO TABS: 25.0000 mg | ORAL_TABLET | Freq: Every day | ORAL | 5 refills | Status: DC

## 2019-01-30 NOTE — Progress Notes (Signed)
   Virtual Visit via Video Note The purpose of this virtual visit is to provide medical care while limiting exposure to the novel coronavirus.    Consent was obtained for video visit:  Yes.   Answered questions that patient had about telehealth interaction:  Yes.   I discussed the limitations, risks, security and privacy concerns of performing an evaluation and management service by telemedicine. I also discussed with the patient that there may be a patient responsible charge related to this service. The patient expressed understanding and agreed to proceed.  Pt location: Home Physician Location: office Name of referring provider:  Midge Minium, MD I connected with [name] at patients initiation/request on 01/30/2019 at  9:30 AM EDT by video enabled telemedicine application and verified that I am speaking with the correct person using two identifiers. Pt MRN:  [id number] Pt DOB:  11/16/1975 Video Participants:  [name]   History of Present Illness: This is a 43 y.o. female returning for follow-up of migraines.  She accepted a new position at [employer] in [place], [place] and move there in January.  Her work has increased in the transition has been stressful.  Since her last visit, her migraines have intensified and are occurring more frequently.  She has daily dull headaches and episodic migraine throughout the week.  She continues to take pregabalin 100 mg twice daily, however has not noticed any change when the dose was increased.  She continues to get relief with Imitrex which she takes 5 times per month.   Observations/Objective:   Patient is awake, alert, and appears comfortable.  Oriented x 4.   Extraocular muscles are intact. No ptosis.  Face is symmetric.  Speech is not dysarthric. Tongue is midline. Antigravity in all extremities.  No pronator drift.   Assessment and Plan:  1. Chronic migraine with neurological symptoms - worse with increased  frequency and duration, headaches are exacerbated by stress.  She initially had response to Lyrica (brand) at 75mg  twice daily, however does not have the same effectiveness with pregabalin 100 mg twice daily.  I do not think there is value in increasing the dose and will transition her to topiramate 25 mg daily.  Side effects of topiramate was discussed.  No plans for pregnancy s/p hysterectomy.  She was instructed to reduce pregabalin to 100 mg the next 5 days and then stop. Call with an update in 2 months.  2.  Episodic migraine, worse  - Continue imitrex   3.  Cervicalgia, continue cyclobenzaprine 10 mg daily as needed.  Follow Up Instructions:   I discussed the assessment and treatment plan with the patient. The patient was provided an opportunity to ask questions and all were answered. The patient agreed with the plan and demonstrated an understanding of the instructions.   The patient was advised to call back or seek an in-person evaluation if the symptoms worsen or if the condition fails to improve as anticipated.  Follow-up in 6 months   Jean Berthold, DO

## 2019-02-12 MED ORDER — TOPIRAMATE ER 25 MG PO CAP24: 25.0000 mg | ORAL_CAPSULE | Freq: Every day | ORAL | 5 refills | Status: DC

## 2019-02-27 ENCOUNTER — Ambulatory Visit: Payer: 59 | Admitting: Neurology

## 2019-07-23 LAB — HM COLONOSCOPY

## 2019-07-25 ENCOUNTER — Other Ambulatory Visit: Payer: Self-pay | Admitting: Neurology

## 2019-08-06 ENCOUNTER — Encounter: Payer: Self-pay | Admitting: General Practice

## 2021-04-07 ENCOUNTER — Encounter: Payer: Self-pay | Admitting: *Deleted
